# Patient Record
Sex: Male | Born: 1995 | Race: Black or African American | Hispanic: No | Marital: Single | Smoking: Former smoker
Health system: Southern US, Community
[De-identification: ages and names within clinical notes are randomized; demographics above are authoritative.]

## PROBLEM LIST (undated history)

## (undated) DIAGNOSIS — J45909 Unspecified asthma, uncomplicated: Secondary | ICD-10-CM

## (undated) DIAGNOSIS — F909 Attention-deficit hyperactivity disorder, unspecified type: Secondary | ICD-10-CM

---
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
## 2017-01-17 ENCOUNTER — Encounter (HOSPITAL_COMMUNITY): Payer: Self-pay | Admitting: Emergency Medicine

## 2017-01-17 ENCOUNTER — Other Ambulatory Visit: Payer: Self-pay

## 2017-01-17 ENCOUNTER — Emergency Department (HOSPITAL_COMMUNITY)
Admission: EM | Admit: 2017-01-17 | Discharge: 2017-01-17 | Disposition: A | Discharge status: Home / Self Care | Payer: Medicaid Other | Attending: Emergency Medicine | Admitting: Emergency Medicine

## 2017-01-17 DIAGNOSIS — J029 Acute pharyngitis, unspecified: Secondary | ICD-10-CM | POA: Diagnosis present

## 2017-01-17 DIAGNOSIS — F909 Attention-deficit hyperactivity disorder, unspecified type: Secondary | ICD-10-CM | POA: Insufficient documentation

## 2017-01-17 DIAGNOSIS — J069 Acute upper respiratory infection, unspecified: Secondary | ICD-10-CM | POA: Diagnosis not present

## 2017-01-17 DIAGNOSIS — J45909 Unspecified asthma, uncomplicated: Secondary | ICD-10-CM | POA: Diagnosis not present

## 2017-01-17 LAB — RAPID STREP SCREEN (MED CTR MEBANE ONLY): STREPTOCOCCUS, GROUP A SCREEN (DIRECT): NEGATIVE

## 2017-01-17 MED ORDER — FLUTICASONE PROPIONATE 50 MCG/ACT NA SUSP: 1.0000 | Freq: Every day | NASAL | 0 refills | Status: DC

## 2017-01-17 MED ORDER — PROMETHAZINE-DM 6.25-15 MG/5ML PO SYRP: 5.0000 mL | ORAL_SOLUTION | Freq: Four times a day (QID) | ORAL | 0 refills | Status: DC | PRN

## 2017-01-17 NOTE — Discharge Instructions (Signed)
You likely have a viral illness.  This will be treated symptomatically. Use Tylenol or ibuprofen as needed for fevers, body aches, and sore throat. Use Flonase daily for nasal congestion and cough. Use cough syrup as needed for cough or sore throat. Make sure you stay well-hydrated with water. Wash your hands frequently to prevent spread of infection. Follow-up with your primary care doctor in 1 week if your symptoms are not improving. Return to the emergency room if you develop chest pain, difficulty breathing, or any new or worsening symptoms.

## 2017-01-17 NOTE — ED Triage Notes (Signed)
Pt reports right ear pain and sore throat x 3 days.

## 2017-01-17 NOTE — ED Provider Notes (Signed)
[facility] Provider [facility]   CSN: [id number] Arrival date & time: 01/17/17  0945     History   Chief Complaint Chief Complaint  Patient presents with  . Sore Throat    HPI Jeremy Michael is a 21 y.o. male presenting with sore throat and ear pain.  Pt states that for the past 3 days, he has had a sore throat.  Over the past day, pain has radiated to his right ear.  He reports pain with swallowing.  He denies fevers, chills, nasal congestion, eye pain or irritation, cough, chest pain, shortness breath, nausea, vomiting, abdominal pain.  He denies sick contacts.  He denies difficulty handling his secretions or pain with opening his mouth.  He states he does not take any other medicines daily and has no medical history.  He has been using over-the-counter sore throat spray without improvement of symptoms.  HPI  Past Medical History:  Diagnosis Date  . ADHD (attention deficit hyperactivity disorder)   . Asthma     Patient Active Problem List   Diagnosis Date Noted  . Left tibial fracture 08/01/2012    Past Surgical History:  Procedure Laterality Date  . ORIF PATELLA Left 08/02/2012   Procedure: OPEN REDUCTION INTERNAL (ORIF) FIXATION Proximal tibia;  Surgeon: Eulas PostJoshua P Landau, MD;  Location: [facility] OR[facility];  Service: Orthopedics;  Laterality: Left;       Home Medications    Prior to Admission medications   Medication Sig Start Date End Date Taking? Authorizing Provider  cyclobenzaprine (FLEXERIL) 10 MG tablet Take 1 tablet (10 mg total) by mouth 3 (three) times daily as needed. Patient not taking: Reported on 10/24/2016 11/08/15   Triplett, Tammy, PA-C  fluticasone (FLONASE) 50 MCG/ACT nasal spray Place 1 spray into both nostrils daily. 01/17/17   Tavarus Poteete, PA-C  ibuprofen (ADVIL,MOTRIN) 600 MG tablet Take 1 tablet (600 mg total) 4 (four) times daily by mouth. Patient not taking: Reported on 01/17/2017 12/14/16   Ivery QualeBryant, Hobson, PA-C    promethazine-dextromethorphan (PROMETHAZINE-DM) 6.25-15 MG/5ML syrup Take 5 mLs by mouth 4 (four) times daily as needed for cough. 01/17/17   Breiona Couvillon, PA-C    Family History History reviewed. No pertinent family history.  Social History Social History   Tobacco Use  . Smoking status: Never Smoker  . Smokeless tobacco: Never Used  Substance Use Topics  . Alcohol use: No  . Drug use: No     Allergies   Halls cough drops [menthol]   Review of Systems Review of Systems  Constitutional: Negative for chills and fever.  HENT: Positive for ear pain and sore throat. Negative for congestion, sinus pressure, sinus pain and voice change.      Physical Exam Updated Vital Signs BP 131/76 (BP Location: Right Arm)   Pulse 67   Temp 98.2 F (36.8 C) (Oral)   Resp 16   Ht 5\' 7"  (1.702 m)   Wt 79.4 kg (175 lb)   SpO2 100%   BMI 27.41 kg/m   Physical Exam  Constitutional: He is oriented to person, place, and time. He appears well-developed and well-nourished. No distress.  HENT:  Head: Normocephalic and atraumatic.  Right Ear: Tympanic membrane, external ear and ear canal normal.  Left Ear: Tympanic membrane, external ear and ear canal normal.  Nose: Nose normal. Right sinus exhibits no maxillary sinus tenderness and no frontal sinus tenderness. Left sinus exhibits no maxillary sinus tenderness and no frontal sinus tenderness.  Mouth/Throat: Uvula is midline,  oropharynx is clear and moist and mucous membranes are normal. No tonsillar exudate.  OP clear without tonsillar swelling or exudate.  Uvula midline with equal palate rise.  No pain under the tongue.  No trismus.  No difficulty handling secretions.  Voice is not muffled.  Eyes: Conjunctivae and EOM are normal. Pupils are equal, round, and reactive to light.  Neck: Normal range of motion.  Cardiovascular: Normal rate, regular rhythm and intact distal pulses.  Pulmonary/Chest: Effort normal and breath sounds normal.  He has no decreased breath sounds. He has no wheezes. He has no rhonchi. He has no rales.  Pt speaking in full sentences without difficulty. Clear lung sounds in all fields  Abdominal: He exhibits no distension.  Musculoskeletal: Normal range of motion.  Lymphadenopathy:    He has no cervical adenopathy.  Neurological: He is alert and oriented to person, place, and time.  Skin: Skin is warm.  Psychiatric: He has a normal mood and affect.  Nursing note and vitals reviewed.    ED Treatments / Results  Labs (all labs ordered are listed, but only abnormal results are displayed) Labs Reviewed  RAPID STREP SCREEN (NOT AT [facility])  CULTURE, GROUP A STREP [facility])    EKG  EKG Interpretation None       Radiology No results found.  Procedures Procedures (including critical care time)  Medications Ordered in ED Medications - No data to display   Initial Impression / Assessment and Plan / ED Course  I have reviewed the triage vital signs and the nursing notes.  Pertinent labs & imaging results that were available during my care of the patient were reviewed by me and considered in my medical decision making (see chart for details).     Patient presenting with sore throat x3 days.  Physical exam reassuring, he is afebrile, not tachycardic, appears nontoxic.  No tonsillar swelling or exudate.  Doubt strep, peritonsillar, or bacterial infection.  Likely viral illness.  Strep test pending.  Strep negative.  Will treat symptomatically.  Discussed findings with patient.  At this time, patient appears safe for discharge.  Return precautions given.  Patient states he understands and agrees to plan.   Final Clinical Impressions(s) / ED Diagnoses   Final diagnoses:  Upper respiratory tract infection, unspecified type    ED Discharge Orders        Ordered    fluticasone (FLONASE) 50 MCG/ACT nasal spray  Daily     01/17/17 1104    promethazine-dextromethorphan (PROMETHAZINE-DM) 6.25-15  MG/5ML syrup  4 times daily PRN     01/17/17 1104       Hadar Elgersma, PA-C 01/17/17 1548    Doug SouJacubowitz, Sam, MD 01/17/17 1614

## 2017-01-19 LAB — CULTURE, GROUP A STREP (THRC)

## 2017-01-28 ENCOUNTER — Encounter (HOSPITAL_COMMUNITY): Payer: Self-pay | Admitting: Emergency Medicine

## 2017-01-28 ENCOUNTER — Emergency Department (HOSPITAL_COMMUNITY): Payer: Medicaid Other

## 2017-01-28 ENCOUNTER — Other Ambulatory Visit: Payer: Self-pay

## 2017-01-28 ENCOUNTER — Emergency Department (HOSPITAL_COMMUNITY)
Admission: EM | Admit: 2017-01-28 | Discharge: 2017-01-28 | Disposition: A | Discharge status: Home / Self Care | Payer: Medicaid Other | Attending: Emergency Medicine | Admitting: Emergency Medicine

## 2017-01-28 DIAGNOSIS — Y999 Unspecified external cause status: Secondary | ICD-10-CM | POA: Diagnosis not present

## 2017-01-28 DIAGNOSIS — Y939 Activity, unspecified: Secondary | ICD-10-CM | POA: Diagnosis not present

## 2017-01-28 DIAGNOSIS — W2209XA Striking against other stationary object, initial encounter: Secondary | ICD-10-CM | POA: Diagnosis not present

## 2017-01-28 DIAGNOSIS — S6991XA Unspecified injury of right wrist, hand and finger(s), initial encounter: Secondary | ICD-10-CM | POA: Diagnosis present

## 2017-01-28 DIAGNOSIS — J45909 Unspecified asthma, uncomplicated: Secondary | ICD-10-CM | POA: Diagnosis not present

## 2017-01-28 DIAGNOSIS — F909 Attention-deficit hyperactivity disorder, unspecified type: Secondary | ICD-10-CM | POA: Insufficient documentation

## 2017-01-28 DIAGNOSIS — Y929 Unspecified place or not applicable: Secondary | ICD-10-CM | POA: Insufficient documentation

## 2017-01-28 DIAGNOSIS — S62101A Fracture of unspecified carpal bone, right wrist, initial encounter for closed fracture: Secondary | ICD-10-CM | POA: Diagnosis not present

## 2017-01-28 MED ORDER — IBUPROFEN 600 MG PO TABS: 600.0000 mg | ORAL_TABLET | Freq: Four times a day (QID) | ORAL | 0 refills | Status: DC | PRN

## 2017-01-28 MED ORDER — HYDROCODONE-ACETAMINOPHEN 5-325 MG PO TABS: ORAL_TABLET | ORAL | 0 refills | Status: DC

## 2017-01-28 NOTE — Discharge Instructions (Signed)
Elevate your hand when possible.  Call one of the orthopedic providers listed to arrange a follow-up appt
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
## 2017-02-03 ENCOUNTER — Emergency Department (HOSPITAL_COMMUNITY)
Admission: EM | Admit: 2017-02-03 | Discharge: 2017-02-03 | Disposition: A | Discharge status: Home / Self Care | Payer: Medicaid Other | Attending: Emergency Medicine | Admitting: Emergency Medicine

## 2017-02-03 ENCOUNTER — Encounter (HOSPITAL_COMMUNITY): Payer: Self-pay

## 2017-02-03 DIAGNOSIS — S62001A Unspecified fracture of navicular [scaphoid] bone of right wrist, initial encounter for closed fracture: Secondary | ICD-10-CM

## 2017-02-03 DIAGNOSIS — J45909 Unspecified asthma, uncomplicated: Secondary | ICD-10-CM | POA: Diagnosis not present

## 2017-02-03 DIAGNOSIS — X58XXXD Exposure to other specified factors, subsequent encounter: Secondary | ICD-10-CM | POA: Diagnosis not present

## 2017-02-03 DIAGNOSIS — S6991XD Unspecified injury of right wrist, hand and finger(s), subsequent encounter: Secondary | ICD-10-CM | POA: Diagnosis present

## 2017-02-03 DIAGNOSIS — S62001D Unspecified fracture of navicular [scaphoid] bone of right wrist, subsequent encounter for fracture with routine healing: Secondary | ICD-10-CM | POA: Diagnosis not present

## 2017-02-03 NOTE — Discharge Instructions (Signed)
Please keep your splint clean and dry.  Please see Dr. Romeo AppleHarrison for fracture management as soon as possible.

## 2017-02-03 NOTE — ED Triage Notes (Signed)
Pt seen 6 days for closed fracture of right wrist. Pt reports he has not followed up with harrison and hand specialist. States he did not have a ride and has been unable to get pain meds filled. Pt has temporary cast on with sling.

## 2017-02-03 NOTE — ED Provider Notes (Signed)
[facility] EMERGENCY DEPARTMENT Provider Note   CSN: [id number] Arrival date & time: 02/03/17  4098     History   Chief Complaint Chief Complaint  Patient presents with  . Hand Pain    HPI Jeremy Michael is a 22 y.o. male.  Patient has a glucose  Patient is a 22 year old male sustained a fracture to his right navicular on January 2.  The patient wanted to get his cast removed and wanted to have the fracture evaluated.  He was referred to orthopedics but says he is not been able to contact orthopedics since the incident.  He has good range of motion of the fingers.  No nausea/vomiting/fever or swelling of the extremity.      Past Medical History:  Diagnosis Date  . ADHD (attention deficit hyperactivity disorder)   . Asthma     Patient Active Problem List   Diagnosis Date Noted  . Left tibial fracture 08/01/2012    Past Surgical History:  Procedure Laterality Date  . ORIF PATELLA Left 08/02/2012   Procedure: OPEN REDUCTION INTERNAL (ORIF) FIXATION Proximal tibia;  Surgeon: Eulas Post, MD;  Location: [facility];  Service: Orthopedics;  Laterality: Left;       Home Medications    Prior to Admission medications   Medication Sig Start Date End Date Taking? Authorizing Provider  HYDROcodone-acetaminophen (NORCO/VICODIN) 5-325 MG tablet Take one tab po q 4 hrs prn pain 01/28/17   Triplett, Tammy, PA-C  ibuprofen (ADVIL,MOTRIN) 600 MG tablet Take 1 tablet (600 mg total) by mouth every 6 (six) hours as needed. 01/28/17   Triplett, Babette Relic, PA-C    Family History No family history on file.  Social History Social History   Tobacco Use  . Smoking status: Never Smoker  . Smokeless tobacco: Never Used  Substance Use Topics  . Alcohol use: No  . Drug use: Yes    Types: Marijuana     Allergies   Halls cough drops [menthol]   Review of Systems Review of Systems  Constitutional: Negative for activity change.       All ROS Neg except as noted in HPI  HENT:  Negative for nosebleeds.   Eyes: Negative for photophobia and discharge.  Respiratory: Negative for cough, shortness of breath and wheezing.   Cardiovascular: Negative for chest pain and palpitations.  Gastrointestinal: Negative for abdominal pain and blood in stool.  Genitourinary: Negative for dysuria, frequency and hematuria.  Musculoskeletal: Positive for arthralgias. Negative for back pain and neck pain.       Hand pain  Skin: Negative.   Neurological: Negative for dizziness, seizures and speech difficulty.  Psychiatric/Behavioral: Negative for confusion and hallucinations.     Physical Exam Updated Vital Signs BP 124/77 (BP Location: Right Arm)   Pulse 65   Temp 98.4 F (36.9 C) (Oral)   Resp 18   Ht 5\' 8"  (1.727 m)   Wt 81.6 kg (180 lb)   SpO2 100%   BMI 27.37 kg/m   Physical Exam  Constitutional: He is oriented to person, place, and time. He appears well-developed and well-nourished.  Non-toxic appearance.  HENT:  Head: Normocephalic.  Right Ear: Tympanic membrane and external ear normal.  Left Ear: Tympanic membrane and external ear normal.  Eyes: EOM and lids are normal. Pupils are equal, round, and reactive to light.  Neck: Normal range of motion. Neck supple. Carotid bruit is not present.  Cardiovascular: Normal rate, regular rhythm, normal heart sounds, intact distal pulses and normal pulses.  Pulmonary/Chest: Breath sounds normal. No respiratory distress.  Abdominal: Soft. Bowel sounds are normal. There is no tenderness. There is no guarding.  Musculoskeletal: Normal range of motion.  Patient has a cast/splint on the left wrist.  Capillary refill is less than 2 seconds.  Fingers are warm.  There is full range of motion of the left shoulder and elbow.  Lymphadenopathy:       Head (right side): No submandibular adenopathy present.       Head (left side): No submandibular adenopathy present.    He has no cervical adenopathy.  Neurological: He is alert and  oriented to person, place, and time. He has normal strength. No cranial nerve deficit or sensory deficit.  Skin: Skin is warm and dry.  Psychiatric: He has a normal mood and affect. His speech is normal.  Nursing note and vitals reviewed.    ED Treatments / Results  Labs (all labs ordered are listed, but only abnormal results are displayed) Labs Reviewed - No data to display  EKG  EKG Interpretation None       Radiology No results found.  Procedures Procedures (including critical care time)  Medications Ordered in ED Medications - No data to display   Initial Impression / Assessment and Plan / ED Course  I have reviewed the triage vital signs and the nursing notes.  Pertinent labs & imaging results that were available during my care of the patient were reviewed by me and considered in my medical decision making (see chart for details).       Final Clinical Impressions(s) / ED Diagnoses MDM I discussed with the patient that he has a navicular fracture on x-ray.  I reviewed the x-rays.  I also reviewed with the patient that he needs to be evaluated by orthopedics for splints Were Removed .  I discussed with him the importance of keeping a close follow-up on a navicular fracture.  The patient acknowledges understanding.  Patient is in agreement with this plan.  Given the patient the and the phone number Dr. Romeo AppleHarrison, the patient says he will follow-up   Final diagnoses:  Closed nondisplaced fracture of scaphoid of right wrist, unspecified portion of scaphoid, initial encounter    ED Discharge Orders    None       Ivery QualeBryant, Teonna Coonan, PA-C 02/03/17 1846    Doug SouJacubowitz, Sam, MD 02/04/17 [phone]

## 2017-02-06 ENCOUNTER — Encounter: Payer: Self-pay | Admitting: Orthopedic Surgery

## 2017-02-06 ENCOUNTER — Telehealth: Payer: Self-pay | Admitting: Orthopedic Surgery

## 2017-02-06 NOTE — Telephone Encounter (Signed)
Patient called today for an appointment.  I told him that I would have to pull his information and call him back.  Patient [place] for his [place].  I called [facility] and they stated that he has not been to their office since 2017 therefore he would need to schedule an appointment with them before they could give us approval.  I have tried to call this patient back multiple times to let him know he needs to see his PCP but I get a message stating his voicemail has not been set up.  I tried his emergency contact number but this number is not working.  I will send a letter to the patient asking to him to call our office ASAP.

## 2017-02-11 ENCOUNTER — Telehealth: Payer: Self-pay | Admitting: Orthopedic Surgery

## 2017-02-11 NOTE — Telephone Encounter (Signed)
Patient called late this afternoon, (4:55) asking about an appointment with Dr. Romeo AppleHarrison.  I told him that we tried to call him back several times the other day but could not leave a message on his voicemail.  I told him that his PCP states that he will probably needs to be seen there before they will give approval for him to come to our office.  I told him to call them ASAP so that they can discuss about getting a referral to our office.  He said he would do this.

## 2017-02-12 ENCOUNTER — Telehealth: Payer: Self-pay | Admitting: Orthopedic Surgery

## 2017-02-12 NOTE — Telephone Encounter (Signed)
I called and spoke to Mr. Jeremy Michael again today.  I asked him if he had spoken to his PCP to get a referral to our office.  He said he had not called them.  I reminded him that I had spoken with that office and they will not give approval for him to be seen here until they at least talk to him.  I gave him the PCP phone number and asked him to give them.  He said he would

## 2017-02-25 ENCOUNTER — Other Ambulatory Visit: Payer: Self-pay

## 2017-02-25 ENCOUNTER — Encounter (HOSPITAL_COMMUNITY): Payer: Self-pay | Admitting: *Deleted

## 2017-02-25 ENCOUNTER — Emergency Department (HOSPITAL_COMMUNITY)
Admission: EM | Admit: 2017-02-25 | Discharge: 2017-02-25 | Disposition: A | Discharge status: Home / Self Care | Payer: Medicaid Other | Attending: Emergency Medicine | Admitting: Emergency Medicine

## 2017-02-25 DIAGNOSIS — S62101D Fracture of unspecified carpal bone, right wrist, subsequent encounter for fracture with routine healing: Secondary | ICD-10-CM | POA: Diagnosis not present

## 2017-02-25 DIAGNOSIS — J45909 Unspecified asthma, uncomplicated: Secondary | ICD-10-CM | POA: Insufficient documentation

## 2017-02-25 DIAGNOSIS — X58XXXD Exposure to other specified factors, subsequent encounter: Secondary | ICD-10-CM | POA: Insufficient documentation

## 2017-02-25 DIAGNOSIS — S62101S Fracture of unspecified carpal bone, right wrist, sequela: Secondary | ICD-10-CM

## 2017-02-25 NOTE — Discharge Instructions (Addendum)
You have a broken bone in your wrists because of the navicular or scaphoid bone.  It is very important that she was seen by the orthopedic specialist concerning this particular fracture.  Please keep your's splint clean and dry.  Use your sling.  Please see Dr. Romeo AppleHarrison as soon as possible concerning your fracture. VERY IMPORTANT.

## 2017-02-25 NOTE — ED Provider Notes (Signed)
[facility] EMERGENCY DEPARTMENT Provider Note   CSN: [id number] Arrival date & time: 02/25/17  1911     History   Chief Complaint Chief Complaint  Patient presents with  . Wrist Pain    HPI Jeremy Michael is a 22 y.o. male.  Patient is a 22 year old male who presents to the emergency department because of wrist pain.  The patient sustained a fracture to his navicular on January 1.  He was reevaluated on January 7 for the same problem.  He presents now because he says he still having pain.  He states he has not been to see the orthopedic specialist.  He is aware that on both occasions he was told about the importance of seeing an orthopedic specialist because of the navicular type fracture.  The patient is requesting to have the splint redone and he would also like to have the information that he was given with the orthopedic surgeon name address and phone number.  No new falls reported.  No new injury to the wrist reported.      Past Medical History:  Diagnosis Date  . ADHD (attention deficit hyperactivity disorder)   . Asthma     Patient Active Problem List   Diagnosis Date Noted  . Left tibial fracture 08/01/2012    Past Surgical History:  Procedure Laterality Date  . ORIF PATELLA Left 08/02/2012   Procedure: OPEN REDUCTION INTERNAL (ORIF) FIXATION Proximal tibia;  Surgeon: Eulas PostJoshua P Landau, MD;  Location: [facility];  Service: Orthopedics;  Laterality: Left;       Home Medications    Prior to Admission medications   Medication Sig Start Date End Date Taking? Authorizing Provider  HYDROcodone-acetaminophen (NORCO/VICODIN) 5-325 MG tablet Take one tab po q 4 hrs prn pain 01/28/17   Triplett, Tammy, PA-C  ibuprofen (ADVIL,MOTRIN) 600 MG tablet Take 1 tablet (600 mg total) by mouth every 6 (six) hours as needed. 01/28/17   Triplett, Babette Relicammy, PA-C    Family History No family history on file.  Social History Social History   Tobacco Use  . Smoking status: Never  Smoker  . Smokeless tobacco: Never Used  Substance Use Topics  . Alcohol use: No  . Drug use: Yes    Types: Marijuana     Allergies   Halls cough drops [menthol]   Review of Systems Review of Systems  Constitutional: Negative for activity change.       All ROS Neg except as noted in HPI  HENT: Negative for nosebleeds.   Eyes: Negative for photophobia and discharge.  Respiratory: Negative for cough, shortness of breath and wheezing.   Cardiovascular: Negative for chest pain and palpitations.  Gastrointestinal: Negative for abdominal pain and blood in stool.  Genitourinary: Negative for dysuria, frequency and hematuria.  Musculoskeletal: Positive for arthralgias. Negative for back pain and neck pain.  Skin: Negative.   Neurological: Negative for dizziness, seizures and speech difficulty.  Psychiatric/Behavioral: Negative for confusion and hallucinations.     Physical Exam Updated Vital Signs BP 117/71   Pulse 87   Temp 98.7 F (37.1 C)   Resp 20   Ht 5\' 7"  (1.702 m)   Wt 81.6 kg (180 lb)   SpO2 96%   BMI 28.19 kg/m    Physical Exam  Constitutional: He is oriented to person, place, and time. He appears well-developed and well-nourished.  Non-toxic appearance.  HENT:  Head: Normocephalic.  Right Ear: Tympanic membrane and external ear normal.  Left Ear: Tympanic membrane and  external ear normal.  Eyes: EOM and lids are normal. Pupils are equal, round, and reactive to light.  Neck: Normal range of motion. Neck supple. Carotid bruit is not present.  Cardiovascular: Normal rate, regular rhythm, normal heart sounds, intact distal pulses and normal pulses.  Pulmonary/Chest: Breath sounds normal. No respiratory distress.  Abdominal: Soft. Bowel sounds are normal. There is no tenderness. There is no guarding.  Musculoskeletal: Normal range of motion.  There is full range of motion of the right shoulder.  The patient is placed in a sugar tong splint because of a navicular  fracture.  Capillary refill is less than 2 seconds.  There are no temperature changes of the fingers.  There is good range of motion of the fingers.  No sensory deficits appreciated.  The splint remains in place.  The Ace wrap has worn out.  Lymphadenopathy:       Head (right side): No submandibular adenopathy present.       Head (left side): No submandibular adenopathy present.    He has no cervical adenopathy.  Neurological: He is alert and oriented to person, place, and time. He has normal strength. No cranial nerve deficit or sensory deficit.  Skin: Skin is warm and dry.  Psychiatric: He has a normal mood and affect. His speech is normal.  Nursing note and vitals reviewed.    ED Treatments / Results  Labs (all labs ordered are listed, but only abnormal results are displayed) Labs Reviewed - No data to display  EKG  EKG Interpretation None       Radiology No results found.  Procedures Procedures (including critical care time)  Medications Ordered in ED Medications - No data to display   Initial Impression / Assessment and Plan / ED Course  I have reviewed the triage vital signs and the nursing notes.  Pertinent labs & imaging results that were available during my care of the patient were reviewed by me and considered in my medical decision making (see chart for details).       Final Clinical Impressions(s) / ED Diagnoses MDM  Patient sustained a fracture to the right wrist January 1.  He was advised to see orthopedics.  Returned on January 7 for reassessment.  He was again strongly advised to see orthopedics because of the navicular type fracture.  He returns tonight to have his splint adjusted.  He states that he is going to call the orthopedic specialist on tomorrow for an appointment.  Patient also requests the name and phone number of the physician that he was referred to.  An Ace bandage was replaced on the splint.  The sling remains strong and intact.  Again  it was emphasized to the patient the importance of seeing orthopedics concerning this particular fracture of the wrist.  The patient acknowledges understanding of the fracture, and the importance of being seen by orthopedics as soon as possible.   Final diagnoses:  Closed fracture of right wrist, sequela    ED Discharge Orders    None      Ivery Quale, PA-C 02/25/17 2148  Ivery Quale, PA-C 02/25/17 2210  Mancel Bale, MD 02/28/17 2326

## 2017-02-25 NOTE — ED Triage Notes (Signed)
Return visit for wrist fracture, has not followed up with ortho, patient still has splint in  Place from ed visit

## 2017-02-25 NOTE — ED Notes (Signed)
Pt ambulatory to waiting room. Pt verbalized understanding of discharge instructions.   

## 2017-03-04 ENCOUNTER — Encounter: Payer: Self-pay | Admitting: Orthopedic Surgery

## 2017-03-04 ENCOUNTER — Telehealth: Payer: Self-pay | Admitting: Orthopedic Surgery

## 2017-03-04 NOTE — Telephone Encounter (Signed)
°  I have spoken with Mr. Jeremy Michael several times and have explained that in order for him to be seen here, he needed to get Medicaid approval from his family doctor.  I was told by his primary care physician's office that since they have not seen him in a long time, they would not give approval for him to be seen here unless he was seen in their office first.  I explained this to him several times.  I have spoken to the clinical staff, Amy Littrell who assists Dr. Romeo AppleHarrison in the clinical part of this office and she has suggested that it would  be better for him to schedule an appointment with [facility] [place].  Their phone number is [phone].  I have tried to call him several times yesterday afternoon and today but have been unable to speak with him regarding this.  Therefore, I have sent him a letter regarding this.

## 2017-03-05 NOTE — Telephone Encounter (Signed)
Patient called back today - said he had not received our calls, which were made multiple times. Also relayed a letter has been mailed to him.  Relayed the information in telephone encounter as noted, including the phone number for [facility] [place], where he has been recommended to be seen, per our providers.  States he will do this.

## 2017-04-23 ENCOUNTER — Other Ambulatory Visit: Payer: Self-pay

## 2017-04-23 ENCOUNTER — Emergency Department (HOSPITAL_COMMUNITY): Payer: Medicaid Other

## 2017-04-23 ENCOUNTER — Emergency Department (HOSPITAL_COMMUNITY)
Admission: EM | Admit: 2017-04-23 | Discharge: 2017-04-23 | Disposition: A | Discharge status: Home / Self Care | Payer: Medicaid Other | Attending: Emergency Medicine | Admitting: Emergency Medicine

## 2017-04-23 ENCOUNTER — Encounter (HOSPITAL_COMMUNITY): Payer: Self-pay | Admitting: Emergency Medicine

## 2017-04-23 DIAGNOSIS — F909 Attention-deficit hyperactivity disorder, unspecified type: Secondary | ICD-10-CM | POA: Diagnosis not present

## 2017-04-23 DIAGNOSIS — J45909 Unspecified asthma, uncomplicated: Secondary | ICD-10-CM | POA: Insufficient documentation

## 2017-04-23 DIAGNOSIS — M25531 Pain in right wrist: Secondary | ICD-10-CM | POA: Diagnosis present

## 2017-04-23 MED ORDER — IBUPROFEN 400 MG PO TABS
400.0000 mg | ORAL_TABLET | Freq: Once | ORAL | Status: AC
  Administered 2017-04-23: 400 mg via ORAL
  Filled 2017-04-23 (×2): qty 1

## 2017-04-23 MED ORDER — NAPROXEN 500 MG PO TABS: 500.0000 mg | ORAL_TABLET | Freq: Two times a day (BID) | ORAL | 0 refills | Status: DC

## 2017-04-23 NOTE — Discharge Instructions (Addendum)
You were seen today for wrist pain.  Your x-ray shows healing prior wrist fracture.  You likely will develop some arthritis.  Take naproxen as needed for pain.  Follow-up with orthopedics if you have persistent symptoms.

## 2017-04-23 NOTE — ED Triage Notes (Signed)
Pt c/o right wrist pain after waking up. Pt states he has previous injury and thinks he slept on it last night.

## 2017-04-23 NOTE — ED Notes (Signed)
Patient transported to X-ray 

## 2017-04-23 NOTE — ED Provider Notes (Signed)
[facility] EMERGENCY DEPARTMENT Provider Note   CSN: [id number] Arrival date & time: 04/23/17  0551     History   Chief Complaint Chief Complaint  Patient presents with  . Wrist Pain    HPI Jeremy Michael is a 22 y.o. male.  HPI  This is a 22 year old male with a history of ADHD, asthma who presents with right wrist pain.  Patient reports wrist injury in January.  He never followed up with orthopedics.  He states occasionally he has pain.  He went to bed last night without pain but woke up this morning and thinks "I slept on it wrong."  He reports 6 out of 10 throbbing wrist pain.  Denies numbness or tingling.  Denies new injury.  He has not taken anything for his symptoms.  Chart reviewed.  Patient with probable navicular nondisplaced fracture on January 1.  He had several subsequent visits where he had not followed up and was encouraged to follow-up closely.  Patient states that he was unable to get into see Dr. Romeo Apple.  Past Medical History:  Diagnosis Date  . ADHD (attention deficit hyperactivity disorder)   . Asthma     Patient Active Problem List   Diagnosis Date Noted  . Left tibial fracture 08/01/2012    Past Surgical History:  Procedure Laterality Date  . ORIF PATELLA Left 08/02/2012   Procedure: OPEN REDUCTION INTERNAL (ORIF) FIXATION Proximal tibia;  Surgeon: Eulas Post, MD;  Location: [facility];  Service: Orthopedics;  Laterality: Left;        Home Medications    Prior to Admission medications   Medication Sig Start Date End Date Taking? Authorizing Provider  HYDROcodone-acetaminophen (NORCO/VICODIN) 5-325 MG tablet Take one tab po q 4 hrs prn pain 01/28/17   Triplett, Tammy, PA-C  ibuprofen (ADVIL,MOTRIN) 600 MG tablet Take 1 tablet (600 mg total) by mouth every 6 (six) hours as needed. 01/28/17   Triplett, Tammy, PA-C  naproxen (NAPROSYN) 500 MG tablet Take 1 tablet (500 mg total) by mouth 2 (two) times daily. 04/23/17   Horton, Mayer Masker, MD     Family History No family history on file.  Social History Social History   Tobacco Use  . Smoking status: Never Smoker  . Smokeless tobacco: Never Used  Substance Use Topics  . Alcohol use: No  . Drug use: Not Currently    Types: Marijuana     Allergies   Halls cough drops [menthol]   Review of Systems Review of Systems  Musculoskeletal:       Right wrist pain  Neurological: Negative for weakness and numbness.  All other systems reviewed and are negative.    Physical Exam Updated Vital Signs BP 118/81 (BP Location: Left Arm)   Pulse 61   Temp 97.8 F (36.6 C) (Oral)   Resp 18   Ht 5\' 8"  (1.727 m)   Wt 81.6 kg (180 lb)   SpO2 100%   BMI 27.37 kg/m   Physical Exam  Constitutional: He is oriented to person, place, and time. He appears well-developed and well-nourished.  HENT:  Head: Normocephalic and atraumatic.  Cardiovascular: Normal rate and regular rhythm.  Pulmonary/Chest: Effort normal. No respiratory distress.  Musculoskeletal: He exhibits no edema.  Patient able to flex and extend the wrist approximately 45 degrees in both directions, no obvious deformity, tenderness to palpation over the palmar aspect of the wrist, neurovascularly intact, 2+ radial pulse  Neurological: He is alert and oriented to person, place, and  time.  Skin: Skin is warm and dry.  Psychiatric: He has a normal mood and affect.  Nursing note and vitals reviewed.    ED Treatments / Results  Labs (all labs ordered are listed, but only abnormal results are displayed) Labs Reviewed - No data to display  EKG None  Radiology Dg Wrist Complete Right  Result Date: 04/23/2017 CLINICAL DATA:  Pain.  Injury several months prior EXAM: RIGHT WRIST - COMPLETE 3+ VIEW COMPARISON:  January 28, 2017 FINDINGS: Frontal, oblique, lateral, and ulnar deviation scaphoid image is were obtained. There is subtle sclerosis in the mid scaphoid region at the site of prior apparent nondisplaced  fracture. No avascular necrosis noted. There is mild bowing of the distal fifth metacarpal, likely residua of old trauma. No acute fracture or dislocation evident. Joint spaces appear normal. No erosive change. IMPRESSION: Sclerosis mid scaphoid at site of prior fracture. The appearance is consistent with significant healing of prior fracture. No avascular necrosis. Trauma with remodeling distal fifth metacarpal. No evident acute fracture or dislocation. No appreciable joint space narrowing or erosion. Electronically Signed   By: Bretta BangWilliam  Woodruff III M.D.   On: 04/23/2017 07:26    Procedures Procedures (including critical care time)  Medications Ordered in ED Medications  ibuprofen (ADVIL,MOTRIN) tablet 400 mg (400 mg Oral Given 04/23/17 [id number])     Initial Impression / Assessment and Plan / ED Course  I have reviewed the triage vital signs and the nursing notes.  Pertinent labs & imaging results that were available during my care of the patient were reviewed by me and considered in my medical decision making (see chart for details).     Presents with right wrist pain.  Recent injury but has not followed up.  No recurrent injury.  He has not had any follow-up x-rays.  For this reason I have obtained follow-up x-rays to assess for appropriate healing.  Patient was given ibuprofen.  X-ray shows significant healing of prior fracture without avascular necrosis.  Suspect patient may be developing some arthritis in that wrist secondary to injury.  I again have encouraged him to follow-up with orthopedics given his persistent symptoms and young age with an dominant wrist injury.  Naproxen as needed for pain.  After history, exam, and medical workup I feel the patient has been appropriately medically screened and is safe for discharge home. Pertinent diagnoses were discussed with the patient. Patient was given return precautions.   Final Clinical Impressions(s) / ED Diagnoses   Final diagnoses:   Right wrist pain    ED Discharge Orders        Ordered    naproxen (NAPROSYN) 500 MG tablet  2 times daily     04/23/17 0732       Horton, Mayer Maskerourtney F, MD 04/23/17 [phone]

## 2017-06-04 ENCOUNTER — Encounter (HOSPITAL_COMMUNITY): Payer: Self-pay | Admitting: Emergency Medicine

## 2017-06-04 ENCOUNTER — Emergency Department (HOSPITAL_COMMUNITY)
Admission: EM | Admit: 2017-06-04 | Discharge: 2017-06-04 | Disposition: A | Discharge status: Home / Self Care | Payer: Medicaid Other | Attending: Emergency Medicine | Admitting: Emergency Medicine

## 2017-06-04 ENCOUNTER — Other Ambulatory Visit: Payer: Self-pay

## 2017-06-04 ENCOUNTER — Emergency Department (HOSPITAL_COMMUNITY): Payer: Medicaid Other

## 2017-06-04 DIAGNOSIS — Y9367 Activity, basketball: Secondary | ICD-10-CM | POA: Insufficient documentation

## 2017-06-04 DIAGNOSIS — S8392XA Sprain of unspecified site of left knee, initial encounter: Secondary | ICD-10-CM | POA: Diagnosis not present

## 2017-06-04 DIAGNOSIS — Y92310 Basketball court as the place of occurrence of the external cause: Secondary | ICD-10-CM | POA: Diagnosis not present

## 2017-06-04 DIAGNOSIS — F909 Attention-deficit hyperactivity disorder, unspecified type: Secondary | ICD-10-CM | POA: Diagnosis not present

## 2017-06-04 DIAGNOSIS — S8992XA Unspecified injury of left lower leg, initial encounter: Secondary | ICD-10-CM | POA: Diagnosis present

## 2017-06-04 DIAGNOSIS — Y998 Other external cause status: Secondary | ICD-10-CM | POA: Diagnosis not present

## 2017-06-04 DIAGNOSIS — J45909 Unspecified asthma, uncomplicated: Secondary | ICD-10-CM | POA: Insufficient documentation

## 2017-06-04 DIAGNOSIS — X509XXA Other and unspecified overexertion or strenuous movements or postures, initial encounter: Secondary | ICD-10-CM | POA: Insufficient documentation

## 2017-06-04 MED ORDER — IBUPROFEN 800 MG PO TABS: 800.0000 mg | ORAL_TABLET | Freq: Three times a day (TID) | ORAL | 0 refills | Status: DC

## 2017-06-04 NOTE — ED Provider Notes (Signed)
[facility] EMERGENCY DEPARTMENT Provider Note   CSN: [id number] Arrival date & time: 06/04/17  0200     History   Chief Complaint Chief Complaint  Patient presents with  . Knee Pain    HPI Jeremy Michael is a 22 y.o. male.  Patient presents with complaints of left knee injury.  Patient reports that he was playing basketball when he injured his knee.  He reports that he went up for a lay up and was pushed into a wall.  He is not sure if he banged the knee or twisted it, but he has noticed pain and swelling.  Pain worsens when he tries to walk.  No other injury.     Past Medical History:  Diagnosis Date  . ADHD (attention deficit hyperactivity disorder)   . Asthma     Patient Active Problem List   Diagnosis Date Noted  . Left tibial fracture 08/01/2012    Past Surgical History:  Procedure Laterality Date  . ORIF PATELLA Left 08/02/2012   Procedure: OPEN REDUCTION INTERNAL (ORIF) FIXATION Proximal tibia;  Surgeon: Eulas Post, MD;  Location: [facility];  Service: Orthopedics;  Laterality: Left;        Home Medications    Prior to Admission medications   Medication Sig Start Date End Date Taking? Authorizing Provider  HYDROcodone-acetaminophen (NORCO/VICODIN) 5-325 MG tablet Take one tab po q 4 hrs prn pain 01/28/17   Triplett, Tammy, PA-C  ibuprofen (ADVIL,MOTRIN) 600 MG tablet Take 1 tablet (600 mg total) by mouth every 6 (six) hours as needed. 01/28/17   Triplett, Tammy, PA-C  naproxen (NAPROSYN) 500 MG tablet Take 1 tablet (500 mg total) by mouth 2 (two) times daily. 04/23/17   Horton, Mayer Masker, MD    Family History No family history on file.  Social History Social History   Tobacco Use  . Smoking status: Never Smoker  . Smokeless tobacco: Never Used  Substance Use Topics  . Alcohol use: No  . Drug use: Not Currently    Types: Marijuana     Allergies   Halls cough drops [menthol]   Review of Systems Review of Systems  Musculoskeletal: Positive  for arthralgias.  All other systems reviewed and are negative.    Physical Exam Updated Vital Signs BP 129/84 (BP Location: Left Arm)   Pulse 86   Temp 99.2 F (37.3 C) (Oral)   Resp 16   Ht  (1.702 m)   Wt 68 kg (150 lb)   SpO2 96%   BMI 23.49 kg/m   Physical Exam  Constitutional: He appears well-developed.  HENT:  Head: Atraumatic.  Eyes: Pupils are equal, round, and reactive to light.  Cardiovascular: Normal rate.  Pulmonary/Chest: Breath sounds normal.  Musculoskeletal:       Left knee: He exhibits swelling (Prepatellar effusion). He exhibits normal range of motion, no deformity, no erythema and normal alignment. Tenderness found.     ED Treatments / Results  Labs (all labs ordered are listed, but only abnormal results are displayed) Labs Reviewed - No data to display  EKG None  Radiology Dg Knee Complete 4 Views Left  Result Date: 06/04/2017 CLINICAL DATA:  Initial evaluation for acute injury. EXAM: LEFT KNEE - COMPLETE 4+ VIEW COMPARISON:  Prior radiograph from 08/02/2012. FINDINGS: Postoperative changes present at the proximal tibia with 2 lag fixation screws present. Position alignment stable. No periprosthetic lucency to suggest loosening or failure. No acute fracture or dislocation. No joint effusion. Soft tissues within  normal limits. IMPRESSION: 1. No acute osseous abnormality about the left knee. 2. Sequelae of prior ORIF at the proximal tibia without hardware complication. Electronically Signed   By: Rise Mu M.D.   On: 06/04/2017 04:17    Procedures Procedures (including critical care time)  Medications Ordered in ED Medications - No data to display   Initial Impression / Assessment and Plan / ED Course  I have reviewed the triage vital signs and the nursing notes.  Pertinent labs & imaging results that were available during my care of the patient were reviewed by me and considered in my medical decision making (see chart for  details).     Patient presents with complaints of injury to left knee.  He has had a previous tibia fracture repair performed.  X-ray does not show any acute bone abnormality.  He has swelling and a prevertebral effusion, but no ligamentous instability.  Patient will be placed in knee immobilizer, follow-up with his orthopedic surgeon who performed previous surgery.  Final Clinical Impressions(s) / ED Diagnoses   Final diagnoses:  Sprain of left knee, unspecified ligament, initial encounter    ED Discharge Orders    None       Alivia Cimino, Canary Brim, MD 06/04/17 [phone]

## 2017-06-04 NOTE — ED Triage Notes (Signed)
Pt c/o left knee pain after running into wall and falling while playing basketball. Pt is ambulatory.

## 2017-06-04 NOTE — ED Notes (Signed)
Pt ambulatory to waiting room. Pt verbalized understanding of discharge instructions.   

## 2017-08-02 ENCOUNTER — Other Ambulatory Visit: Payer: Self-pay

## 2017-08-02 ENCOUNTER — Emergency Department (HOSPITAL_COMMUNITY)
Admission: EM | Admit: 2017-08-02 | Discharge: 2017-08-02 | Disposition: A | Discharge status: Home / Self Care | Payer: Medicaid Other | Attending: Emergency Medicine | Admitting: Emergency Medicine

## 2017-08-02 ENCOUNTER — Encounter (HOSPITAL_COMMUNITY): Payer: Self-pay | Admitting: Emergency Medicine

## 2017-08-02 DIAGNOSIS — J45909 Unspecified asthma, uncomplicated: Secondary | ICD-10-CM | POA: Diagnosis not present

## 2017-08-02 DIAGNOSIS — J029 Acute pharyngitis, unspecified: Secondary | ICD-10-CM

## 2017-08-02 DIAGNOSIS — B9789 Other viral agents as the cause of diseases classified elsewhere: Secondary | ICD-10-CM | POA: Insufficient documentation

## 2017-08-02 DIAGNOSIS — J069 Acute upper respiratory infection, unspecified: Secondary | ICD-10-CM | POA: Diagnosis not present

## 2017-08-02 LAB — GROUP A STREP BY PCR: GROUP A STREP BY PCR: NOT DETECTED

## 2017-08-02 MED ORDER — MAGIC MOUTHWASH W/LIDOCAINE: 5.0000 mL | Freq: Three times a day (TID) | ORAL | 0 refills | Status: DC | PRN

## 2017-08-02 MED ORDER — PSEUDOEPHEDRINE HCL 60 MG PO TABS: 60.0000 mg | ORAL_TABLET | Freq: Four times a day (QID) | ORAL | 0 refills | Status: DC | PRN

## 2017-08-02 NOTE — ED Provider Notes (Signed)
[facility] EMERGENCY DEPARTMENT Provider Note   CSN: [id number] Arrival date & time: 08/02/17  1227     History   Chief Complaint Chief Complaint  Patient presents with  . Sore Throat    HPI Jeremy Michael is a 22 y.o. male.  HPI  Jeremy Michael is a 22 y.o. male who presents to the Emergency Department complaining of nasal congestion, sore throat for 2 days.  Symptoms began with the nasal congestion, then sore throat.  No recent sick contacts or exposure to strep throat.  He has not taken any OTC cold medications.  He denies fever, cough, neck pain or shortness of breath.      Past Medical History:  Diagnosis Date  . ADHD (attention deficit hyperactivity disorder)   . Asthma     Patient Active Problem List   Diagnosis Date Noted  . Left tibial fracture 08/01/2012    Past Surgical History:  Procedure Laterality Date  . ORIF PATELLA Left 08/02/2012   Procedure: OPEN REDUCTION INTERNAL (ORIF) FIXATION Proximal tibia;  Surgeon: Eulas PostJoshua P Landau, MD;  Location: [facility];  Service: Orthopedics;  Laterality: Left;        Home Medications    Prior to Admission medications   Medication Sig Start Date End Date Taking? Authorizing Provider  ibuprofen (ADVIL,MOTRIN) 800 MG tablet Take 1 tablet (800 mg total) by mouth 3 (three) times daily. 06/04/17   Gilda CreasePollina, Christopher J, MD  magic mouthwash w/lidocaine SOLN Take 5 mLs by mouth 3 (three) times daily as needed for mouth pain. Swish and spit, do not swallow 08/02/17   Yamilka Lopiccolo, PA-C  pseudoephedrine (SUDAFED) 60 MG tablet Take 1 tablet (60 mg total) by mouth every 6 (six) hours as needed for congestion. 08/02/17   Pauline Ausriplett, Syon Tews, PA-C    Family History History reviewed. No pertinent family history.  Social History Social History   Tobacco Use  . Smoking status: Never Smoker  . Smokeless tobacco: Never Used  Substance Use Topics  . Alcohol use: No  . Drug use: Not Currently    Types: Marijuana      Allergies   Halls cough drops [menthol]   Review of Systems Review of Systems  Constitutional: Negative for activity change, appetite change, chills and fever.  HENT: Positive for congestion, rhinorrhea and sore throat. Negative for ear pain, facial swelling and trouble swallowing.   Eyes: Negative for visual disturbance.  Respiratory: Negative for cough, shortness of breath, wheezing and stridor.   Gastrointestinal: Negative for nausea and vomiting.  Musculoskeletal: Negative for neck pain and neck stiffness.  Skin: Negative for rash.  Neurological: Negative for dizziness, weakness, numbness and headaches.  Hematological: Negative for adenopathy.  All other systems reviewed and are negative.    Physical Exam Updated Vital Signs BP 136/81   Pulse (!) 51   Temp 98.2 F (36.8 C) (Oral)   Resp 18   Ht 5\' 7"  (1.702 m)   Wt 79.4 kg (175 lb)   SpO2 100%   BMI 27.41 kg/m   Physical Exam  Constitutional: He appears well-developed and well-nourished. No distress.  HENT:  Head: Normocephalic.  Right Ear: Tympanic membrane and ear canal normal.  Left Ear: Tympanic membrane and ear canal normal.  Nose: Mucosal edema present. No rhinorrhea.  Mouth/Throat: Uvula is midline and mucous membranes are normal. No trismus in the jaw. No uvula swelling. Posterior oropharyngeal erythema present. No oropharyngeal exudate, posterior oropharyngeal edema or tonsillar abscesses.  Eyes: Conjunctivae are normal.  Neck: Normal range of motion and phonation normal. Neck supple. No Brudzinski's sign and no Kernig's sign noted.  Cardiovascular: Normal rate, regular rhythm and intact distal pulses.  No murmur heard. Pulmonary/Chest: Effort normal and breath sounds normal. No respiratory distress. He has no wheezes. He has no rales.  Abdominal: Soft. There is no tenderness.  Musculoskeletal: He exhibits no edema.  Lymphadenopathy:    He has no cervical adenopathy.  Neurological: He is alert.   Skin: Skin is warm and dry. No rash noted.  Nursing note and vitals reviewed.    ED Treatments / Results  Labs (all labs ordered are listed, but only abnormal results are displayed) Labs Reviewed  GROUP A STREP BY PCR    EKG None  Radiology No results found.  Procedures Procedures (including critical care time)  Medications Ordered in ED Medications - No data to display   Initial Impression / Assessment and Plan / ED Course  I have reviewed the triage vital signs and the nursing notes.  Pertinent labs & imaging results that were available during my care of the patient were reviewed by me and considered in my medical decision making (see chart for details).     Pt with likely viral URI.  Pt is well appearing.  Vitals reviewed.  No concerning sx's for PTA. Pt agrees to symptomatic tx.    Final Clinical Impressions(s) / ED Diagnoses   Final diagnoses:  Upper respiratory tract infection, unspecified type  Viral pharyngitis    ED Discharge Orders        Ordered    magic mouthwash w/lidocaine SOLN  3 times daily PRN     08/02/17 1351    pseudoephedrine (SUDAFED) 60 MG tablet  Every 6 hours PRN     08/02/17 1351       Ashish Rossetti, [place], PA-C 08/03/17 2237    Linwood Dibbles, MD 08/05/17 1317

## 2017-08-02 NOTE — Discharge Instructions (Addendum)
Drink plenty of fluids.  Tylenol or ibuprofen if needed for pain or fever.  Follow-up with the clinic listed if your symptoms are not improving in a week or so

## 2017-08-02 NOTE — ED Triage Notes (Signed)
Sore throat X2 days with no fever.

## 2017-10-25 ENCOUNTER — Other Ambulatory Visit: Payer: Self-pay

## 2017-10-25 ENCOUNTER — Emergency Department (HOSPITAL_COMMUNITY)
Admission: EM | Admit: 2017-10-25 | Discharge: 2017-10-25 | Disposition: A | Discharge status: Home / Self Care | Payer: Medicaid Other | Attending: Emergency Medicine | Admitting: Emergency Medicine

## 2017-10-25 ENCOUNTER — Encounter (HOSPITAL_COMMUNITY): Payer: Self-pay | Admitting: Emergency Medicine

## 2017-10-25 ENCOUNTER — Emergency Department (HOSPITAL_COMMUNITY): Payer: Medicaid Other

## 2017-10-25 DIAGNOSIS — J45909 Unspecified asthma, uncomplicated: Secondary | ICD-10-CM | POA: Insufficient documentation

## 2017-10-25 DIAGNOSIS — F909 Attention-deficit hyperactivity disorder, unspecified type: Secondary | ICD-10-CM | POA: Insufficient documentation

## 2017-10-25 DIAGNOSIS — R1030 Lower abdominal pain, unspecified: Secondary | ICD-10-CM | POA: Diagnosis present

## 2017-10-25 DIAGNOSIS — K5900 Constipation, unspecified: Secondary | ICD-10-CM

## 2017-10-25 LAB — COMPREHENSIVE METABOLIC PANEL
ALT: 21 U/L (ref 0–44)
ANION GAP: 4 — AB (ref 5–15)
AST: 29 U/L (ref 15–41)
Albumin: 4.4 g/dL (ref 3.5–5.0)
Alkaline Phosphatase: 72 U/L (ref 38–126)
BUN: 7 mg/dL (ref 6–20)
CHLORIDE: 106 mmol/L (ref 98–111)
CO2: 29 mmol/L (ref 22–32)
Calcium: 9.2 mg/dL (ref 8.9–10.3)
Creatinine, Ser: 0.97 mg/dL (ref 0.61–1.24)
GFR calc non Af Amer: >60 mL/min (ref >60)
Glucose, Bld: 79 mg/dL (ref 70–99)
POTASSIUM: 3.8 mmol/L (ref 3.5–5.1)
SODIUM: 139 mmol/L (ref 135–145)
Total Bilirubin: 0.6 mg/dL (ref 0.3–1.2)
Total Protein: 8.1 g/dL (ref 6.5–8.1)

## 2017-10-25 LAB — CBC WITH DIFFERENTIAL/PLATELET
Basophils Absolute: 0 10*3/uL (ref 0.0–0.1)
Basophils Relative: 1 %
EOS PCT: 2 %
Eosinophils Absolute: 0.1 10*3/uL (ref 0.0–0.7)
HCT: 44.3 % (ref 39.0–52.0)
Hemoglobin: 15.6 g/dL (ref 13.0–17.0)
LYMPHS ABS: 1.7 10*3/uL (ref 0.7–4.0)
LYMPHS PCT: 43 %
MCH: 31.3 pg (ref 26.0–34.0)
MCHC: 35.2 g/dL (ref 30.0–36.0)
MCV: 89 fL (ref 78.0–100.0)
Monocytes Absolute: 0.3 10*3/uL (ref 0.1–1.0)
Monocytes Relative: 8 %
Neutro Abs: 1.8 10*3/uL (ref 1.7–7.7)
Neutrophils Relative %: 46 %
PLATELETS: 217 10*3/uL (ref 150–400)
RBC: 4.98 MIL/uL (ref 4.22–5.81)
RDW: 12.5 % (ref 11.5–15.5)
WBC: 3.9 10*3/uL — ABNORMAL LOW (ref 4.0–10.5)

## 2017-10-25 LAB — LIPASE, BLOOD: LIPASE: 40 U/L (ref 11–51)

## 2017-10-25 LAB — URINALYSIS, ROUTINE W REFLEX MICROSCOPIC
BILIRUBIN URINE: NEGATIVE
Glucose, UA: NEGATIVE mg/dL
Hgb urine dipstick: NEGATIVE
KETONES UR: NEGATIVE mg/dL
Leukocytes, UA: NEGATIVE
NITRITE: NEGATIVE
Protein, ur: NEGATIVE mg/dL
Specific Gravity, Urine: 1.015 (ref 1.005–1.030)
pH: 7 (ref 5.0–8.0)

## 2017-10-25 MED ORDER — POLYETHYLENE GLYCOL 3350 17 G PO PACK: 17.0000 g | PACK | Freq: Every day | ORAL | 0 refills | Status: DC

## 2017-10-25 MED ORDER — DOCUSATE SODIUM 100 MG PO CAPS: 100.0000 mg | ORAL_CAPSULE | Freq: Two times a day (BID) | ORAL | 0 refills | Status: DC

## 2017-10-25 NOTE — ED Provider Notes (Signed)
[facility] EMERGENCY DEPARTMENT Provider Note   CSN: [id number] Arrival date & time: 10/25/17  1339     History   Chief Complaint Chief Complaint  Patient presents with  . Abdominal Pain  . Constipation    HPI Jeremy Michael is a 22 y.o. male.  Pt presents to the ED today with lower abdominal pain and hard stools.  The pt said he's been constipated for the past several days.  He has not been taking anything otc.  The pt denies n/v or fevers.     Past Medical History:  Diagnosis Date  . ADHD (attention deficit hyperactivity disorder)   . Asthma     Patient Active Problem List   Diagnosis Date Noted  . Left tibial fracture 08/01/2012    Past Surgical History:  Procedure Laterality Date  . ORIF PATELLA Left 08/02/2012   Procedure: OPEN REDUCTION INTERNAL (ORIF) FIXATION Proximal tibia;  Surgeon: Eulas Post, MD;  Location: [facility];  Service: Orthopedics;  Laterality: Left;        Home Medications    Prior to Admission medications   Medication Sig Start Date End Date Taking? Authorizing Provider  docusate sodium (COLACE) 100 MG capsule Take 1 capsule (100 mg total) by mouth every 12 (twelve) hours. 10/25/17   Jacalyn Lefevre, MD  ibuprofen (ADVIL,MOTRIN) 800 MG tablet Take 1 tablet (800 mg total) by mouth 3 (three) times daily. 06/04/17   Gilda Crease, MD  magic mouthwash w/lidocaine SOLN Take 5 mLs by mouth 3 (three) times daily as needed for mouth pain. Swish and spit, do not swallow 08/02/17   Triplett, Tammy, PA-C  polyethylene glycol (MIRALAX) packet Take 17 g by mouth daily. 10/25/17   Jacalyn Lefevre, MD  pseudoephedrine (SUDAFED) 60 MG tablet Take 1 tablet (60 mg total) by mouth every 6 (six) hours as needed for congestion. 08/02/17   Pauline Aus, PA-C    Family History History reviewed. No pertinent family history.  Social History Social History   Tobacco Use  . Smoking status: Never Smoker  . Smokeless tobacco: Never Used  Substance  Use Topics  . Alcohol use: No  . Drug use: Not Currently    Types: Marijuana     Allergies   Halls cough drops [menthol]   Review of Systems Review of Systems  Gastrointestinal: Positive for abdominal pain and constipation.  All other systems reviewed and are negative.    Physical Exam Updated Vital Signs BP 132/71 (BP Location: Right Arm)   Pulse (!) 58   Temp 98.2 F (36.8 C) (Oral)   Resp 16   Ht 5\' 7"  (1.702 m)   Wt 77.1 kg   SpO2 99%   BMI 26.63 kg/m   Physical Exam  Constitutional: He is oriented to person, place, and time. He appears well-developed and well-nourished.  HENT:  Head: Normocephalic and atraumatic.  Mouth/Throat: Oropharynx is clear and moist.  Eyes: Pupils are equal, round, and reactive to light. EOM are normal.  Cardiovascular: Normal rate, regular rhythm, normal heart sounds and intact distal pulses.  Pulmonary/Chest: Effort normal and breath sounds normal.  Abdominal: Normal appearance and bowel sounds are normal.  Neurological: He is alert and oriented to person, place, and time.  Skin: Skin is warm and dry. Capillary refill takes less than 2 seconds.  Psychiatric: He has a normal mood and affect. His behavior is normal.  Nursing note and vitals reviewed.    ED Treatments / Results  Labs (all labs ordered  are listed, but only abnormal results are displayed) Labs Reviewed  CBC WITH DIFFERENTIAL/PLATELET - Abnormal; Notable for the following components:      Result Value   WBC 3.9 (*)    All other components within normal limits  COMPREHENSIVE METABOLIC PANEL - Abnormal; Notable for the following components:   Anion gap 4 (*)    All other components within normal limits  LIPASE, BLOOD  URINALYSIS, ROUTINE W REFLEX MICROSCOPIC    EKG None  Radiology Dg Abd Acute W/chest  Result Date: 10/25/2017 CLINICAL DATA:  Constipation.  Lower abdominal pain. EXAM: DG ABDOMEN ACUTE W/ 1V CHEST COMPARISON:  None. FINDINGS: There is no  evidence of dilated bowel loops or free intraperitoneal air. There is a moderate amount of stool throughout the colon. No radiopaque calculi or other significant radiographic abnormality is seen. Heart size and mediastinal contours are within normal limits. Both lungs are clear. Sclerotic bone lesion in the left ilium likely reflecting a bone island. IMPRESSION: Moderate amount of stool throughout the colon. No acute cardiopulmonary disease. Electronically Signed   By: Elige Ko   On: 10/25/2017 17:21    Procedures Procedures (including critical care time)  Medications Ordered in ED Medications - No data to display   Initial Impression / Assessment and Plan / ED Course  I have reviewed the triage vital signs and the nursing notes.  Pertinent labs & imaging results that were available during my care of the patient were reviewed by me and considered in my medical decision making (see chart for details).    Pt is constipated.  Labs/ua ok.  Pt instructed to drink more water and to eat more fiber.  He will be d/c with miralax and with colace.  Return if worse.    Final Clinical Impressions(s) / ED Diagnoses   Final diagnoses:  Constipation, unspecified constipation type    ED Discharge Orders         Ordered    polyethylene glycol (MIRALAX) packet  Daily     10/25/17 1726    docusate sodium (COLACE) 100 MG capsule  Every 12 hours     10/25/17 1726           Jacalyn Lefevre, MD 10/25/17 1729

## 2017-10-25 NOTE — ED Triage Notes (Signed)
Pt reports 4 days of lower abd pain Also last BM 30 minutes ago which was painful and hard

## 2018-10-01 ENCOUNTER — Emergency Department (HOSPITAL_COMMUNITY): Payer: Medicaid Other

## 2018-10-01 ENCOUNTER — Emergency Department (HOSPITAL_COMMUNITY)
Admission: EM | Admit: 2018-10-01 | Discharge: 2018-10-01 | Discharge status: Against Medical Advice | Payer: Medicaid Other | Attending: Emergency Medicine | Admitting: Emergency Medicine

## 2018-10-01 ENCOUNTER — Other Ambulatory Visit: Payer: Self-pay

## 2018-10-01 ENCOUNTER — Encounter (HOSPITAL_COMMUNITY): Payer: Self-pay | Admitting: Emergency Medicine

## 2018-10-01 DIAGNOSIS — R109 Unspecified abdominal pain: Secondary | ICD-10-CM

## 2018-10-01 DIAGNOSIS — R1030 Lower abdominal pain, unspecified: Secondary | ICD-10-CM | POA: Diagnosis present

## 2018-10-01 DIAGNOSIS — J45909 Unspecified asthma, uncomplicated: Secondary | ICD-10-CM | POA: Insufficient documentation

## 2018-10-01 DIAGNOSIS — Z87891 Personal history of nicotine dependence: Secondary | ICD-10-CM | POA: Diagnosis not present

## 2018-10-01 NOTE — ED Notes (Signed)
Pt was seen walking down the hallway to the exit door. Pt was stopped by another RN and asked if he was leaving and pt stated, "yeah, I'm leaving" and pt kept walking. Pt assumed to have left AMA. Pt did not sign AMA paperwork.

## 2018-10-01 NOTE — ED Triage Notes (Signed)
Pt c/o pain in left side of abd x 2 or 3 days.  Pt says pain worse when he touches the area.  Denies injury.  Denies any n/v/d.

## 2018-10-01 NOTE — ED Provider Notes (Signed)
[facility] EMERGENCY DEPARTMENT Provider Note   CSN: [id number] Arrival date & time: 10/01/18  0920     History   Chief Complaint Chief Complaint  Patient presents with  . Abdominal Pain    HPI Jeremy Michael is a 23 y.o. male.     HPI  This patient is a very healthy 23 year old male without any prior abdominal surgical history who presents with a complaint of left mid to lower abdominal discomfort which is more lateral, he states it seems to get worse when he rubs up into things, he does not have particular pain with palpation, there is no nausea vomiting or diarrhea and in fact he tends to be a little bit more on the constipated side.  He reports that he has had some discomfort intermittently over the last several days but has not had blood in his stools, no nausea, no postprandial pain, no difficulty with urination neither dysuria frequency or urgency.  He states he does not have the pain at this time.  Past Medical History:  Diagnosis Date  . ADHD (attention deficit hyperactivity disorder)   . Asthma     Patient Active Problem List   Diagnosis Date Noted  . Left tibial fracture 08/01/2012    Past Surgical History:  Procedure Laterality Date  . ORIF PATELLA Left 08/02/2012   Procedure: OPEN REDUCTION INTERNAL (ORIF) FIXATION Proximal tibia;  Surgeon: Johnny Bridge, MD;  Location: [facility];  Service: Orthopedics;  Laterality: Left;        Home Medications    Prior to Admission medications   Medication Sig Start Date End Date Taking? Authorizing Provider  docusate sodium (COLACE) 100 MG capsule Take 1 capsule (100 mg total) by mouth every 12 (twelve) hours. 10/25/17   Isla Pence, MD  ibuprofen (ADVIL,MOTRIN) 800 MG tablet Take 1 tablet (800 mg total) by mouth 3 (three) times daily. 06/04/17   Orpah Greek, MD  magic mouthwash w/lidocaine SOLN Take 5 mLs by mouth 3 (three) times daily as needed for mouth pain. Swish and spit, do not swallow 08/02/17    Triplett, Tammy, PA-C  polyethylene glycol (MIRALAX) packet Take 17 g by mouth daily. 10/25/17   Isla Pence, MD  pseudoephedrine (SUDAFED) 60 MG tablet Take 1 tablet (60 mg total) by mouth every 6 (six) hours as needed for congestion. 08/02/17   Triplett, Lynelle Smoke, PA-C    Family History No family history on file.  Social History Social History   Tobacco Use  . Smoking status: Former Research scientist (life sciences)  . Smokeless tobacco: Never Used  Substance Use Topics  . Alcohol use: No  . Drug use: Not Currently    Types: Marijuana     Allergies   Halls cough drops [menthol]   Review of Systems Review of Systems  All other systems reviewed and are negative.    Physical Exam Updated Vital Signs Ht 1.727 m (5\' 8" )   Wt 77.1 kg   SpO2 97%   BMI 25.85 kg/m   Physical Exam Vitals signs and nursing note reviewed.  Constitutional:      General: He is not in acute distress.    Appearance: He is well-developed.  HENT:     Head: Normocephalic and atraumatic.     Mouth/Throat:     Pharynx: No oropharyngeal exudate.  Eyes:     General: No scleral icterus.       Right eye: No discharge.        Left eye: No discharge.  Conjunctiva/sclera: Conjunctivae normal.     Pupils: Pupils are equal, round, and reactive to light.  Neck:     Musculoskeletal: Normal range of motion and neck supple.     Thyroid: No thyromegaly.     Vascular: No JVD.  Cardiovascular:     Rate and Rhythm: Normal rate and regular rhythm.     Heart sounds: Normal heart sounds. No murmur. No friction rub. No gallop.   Pulmonary:     Effort: Pulmonary effort is normal. No respiratory distress.     Breath sounds: Normal breath sounds. No wheezing or rales.  Abdominal:     General: Bowel sounds are normal. There is no distension.     Palpations: Abdomen is soft. There is no mass.     Tenderness: There is no abdominal tenderness.     Comments: There is no particular abdominal pain, I am able to mash deeply both right and  left, upper and lower without any painful response.  There is no rash to the skin  Musculoskeletal: Normal range of motion.        General: No tenderness.  Lymphadenopathy:     Cervical: No cervical adenopathy.  Skin:    General: Skin is warm and dry.     Findings: No erythema or rash.  Neurological:     Mental Status: He is alert.     Coordination: Coordination normal.  Psychiatric:        Behavior: Behavior normal.      ED Treatments / Results  Labs (all labs ordered are listed, but only abnormal results are displayed) Labs Reviewed - No data to display  EKG None  Radiology No results found.  Procedures Procedures (including critical care time)  Medications Ordered in ED Medications - No data to display   Initial Impression / Assessment and Plan / ED Course  I have reviewed the triage vital signs and the nursing notes.  Pertinent labs & imaging results that were available during my care of the patient were reviewed by me and considered in my medical decision making (see chart for details).        This could be related to constipation, the patient did have a bowel movement yesterday but states that he does have to strain to have a bowel movement.  There is no signs of zoster, no tenderness to be consistent with diverticulitis or cholecystitis or appendicitis.  The patient left AGAINST MEDICAL ADVICE walking out of the ER without informing staff, x-ray was never done  Final Clinical Impressions(s) / ED Diagnoses   Final diagnoses:  Abdominal pain, unspecified abdominal location    ED Discharge Orders    None       Eber HongMiller, Lexii Walsh, MD 10/01/18 1020

## 2018-11-10 ENCOUNTER — Other Ambulatory Visit: Payer: Self-pay

## 2018-11-10 ENCOUNTER — Emergency Department (HOSPITAL_COMMUNITY)
Admission: EM | Admit: 2018-11-10 | Discharge: 2018-11-10 | Disposition: A | Discharge status: Home / Self Care | Payer: Medicaid Other | Attending: Emergency Medicine | Admitting: Emergency Medicine

## 2018-11-10 ENCOUNTER — Encounter (HOSPITAL_COMMUNITY): Payer: Self-pay | Admitting: Emergency Medicine

## 2018-11-10 ENCOUNTER — Emergency Department (HOSPITAL_COMMUNITY): Payer: Medicaid Other

## 2018-11-10 DIAGNOSIS — J45909 Unspecified asthma, uncomplicated: Secondary | ICD-10-CM | POA: Diagnosis not present

## 2018-11-10 DIAGNOSIS — Z20828 Contact with and (suspected) exposure to other viral communicable diseases: Secondary | ICD-10-CM | POA: Insufficient documentation

## 2018-11-10 DIAGNOSIS — Z79899 Other long term (current) drug therapy: Secondary | ICD-10-CM | POA: Insufficient documentation

## 2018-11-10 DIAGNOSIS — J069 Acute upper respiratory infection, unspecified: Secondary | ICD-10-CM | POA: Diagnosis not present

## 2018-11-10 DIAGNOSIS — Z87891 Personal history of nicotine dependence: Secondary | ICD-10-CM | POA: Insufficient documentation

## 2018-11-10 DIAGNOSIS — R05 Cough: Secondary | ICD-10-CM | POA: Diagnosis present

## 2018-11-10 LAB — SARS CORONAVIRUS 2 (TAT 6-24 HRS): SARS Coronavirus 2: NEGATIVE

## 2018-11-10 LAB — GROUP A STREP BY PCR: Group A Strep by PCR: NOT DETECTED

## 2018-11-10 MED ORDER — PREDNISONE 20 MG PO TABS
40.0000 mg | ORAL_TABLET | Freq: Once | ORAL | Status: AC
  Administered 2018-11-10: 40 mg via ORAL
  Filled 2018-11-10: qty 2

## 2018-11-10 MED ORDER — PSEUDOEPHEDRINE HCL 60 MG PO TABS
60.0000 mg | ORAL_TABLET | Freq: Once | ORAL | Status: AC
  Administered 2018-11-10: 60 mg via ORAL
  Filled 2018-11-10: qty 1

## 2018-11-10 MED ORDER — ACETAMINOPHEN 325 MG PO TABS
650.0000 mg | ORAL_TABLET | Freq: Once | ORAL | Status: AC | PRN
  Administered 2018-11-10: 650 mg via ORAL
  Filled 2018-11-10: qty 2

## 2018-11-10 MED ORDER — ALBUTEROL SULFATE HFA 108 (90 BASE) MCG/ACT IN AERS
2.0000 | INHALATION_SPRAY | Freq: Once | RESPIRATORY_TRACT | Status: AC
  Administered 2018-11-10: 17:00:00 2 via RESPIRATORY_TRACT
  Filled 2018-11-10: qty 6.7

## 2018-11-10 NOTE — ED Triage Notes (Signed)
Per EMS patient from home. Patient complains of cough and congestion. States he used baking soda and water to clean out his nose. States his main complaint is a sore throat.   Temp of 100.1 in triage.

## 2018-11-10 NOTE — ED Provider Notes (Signed)
[facility] EMERGENCY DEPARTMENT Provider Note   CSN: [id number] Arrival date & time: 11/10/18  1354     History   Chief Complaint Chief Complaint  Patient presents with  . Cough    HPI Jeremy Michael is a 23 y.o. male.     The history is provided by the patient.  Sore Throat This is a new problem. The current episode started 2 days ago. The problem occurs constantly. The problem has been gradually worsening. Pertinent negatives include no chest pain, no abdominal pain and no shortness of breath. Associated symptoms comments: Cough and congestion. Nothing aggravates the symptoms. Nothing relieves the symptoms. Treatments tried: baking soda/ water for congestion. The treatment provided mild relief.    Past Medical History:  Diagnosis Date  . ADHD (attention deficit hyperactivity disorder)   . Asthma     Patient Active Problem List   Diagnosis Date Noted  . Left tibial fracture 08/01/2012    Past Surgical History:  Procedure Laterality Date  . ORIF PATELLA Left 08/02/2012   Procedure: OPEN REDUCTION INTERNAL (ORIF) FIXATION Proximal tibia;  Surgeon: Eulas PostJoshua P Landau, MD;  Location: [facility];  Service: Orthopedics;  Laterality: Left;        Home Medications    Prior to Admission medications   Medication Sig Start Date End Date Taking? Authorizing Provider  docusate sodium (COLACE) 100 MG capsule Take 1 capsule (100 mg total) by mouth every 12 (twelve) hours. 10/25/17   Jacalyn LefevreHaviland, Julie, MD  ibuprofen (ADVIL,MOTRIN) 800 MG tablet Take 1 tablet (800 mg total) by mouth 3 (three) times daily. 06/04/17   Gilda CreasePollina, Christopher J, MD  magic mouthwash w/lidocaine SOLN Take 5 mLs by mouth 3 (three) times daily as needed for mouth pain. Swish and spit, do not swallow 08/02/17   Triplett, Tammy, PA-C  polyethylene glycol (MIRALAX) packet Take 17 g by mouth daily. 10/25/17   Jacalyn LefevreHaviland, Julie, MD  pseudoephedrine (SUDAFED) 60 MG tablet Take 1 tablet (60 mg total) by mouth every 6 (six)  hours as needed for congestion. 08/02/17   Triplett, Babette Relicammy, PA-C    Family History No family history on file.  Social History Social History   Tobacco Use  . Smoking status: Former Games developermoker  . Smokeless tobacco: Never Used  Substance Use Topics  . Alcohol use: No  . Drug use: Not Currently    Types: Marijuana     Allergies   Halls cough drops [menthol]   Review of Systems Review of Systems  Constitutional: Positive for fever. Negative for activity change and appetite change.  HENT: Positive for congestion and sore throat. Negative for ear discharge, ear pain, facial swelling, nosebleeds, rhinorrhea, sinus pressure, sinus pain, sneezing and tinnitus.   Eyes: Negative for photophobia, pain and discharge.  Respiratory: Positive for cough. Negative for choking, shortness of breath and wheezing.   Cardiovascular: Negative for chest pain, palpitations and leg swelling.  Gastrointestinal: Negative for abdominal pain, blood in stool, constipation, diarrhea, nausea and vomiting.  Genitourinary: Negative for difficulty urinating, dysuria, flank pain, frequency and hematuria.  Musculoskeletal: Negative for back pain, gait problem, myalgias and neck pain.  Skin: Negative for color change, rash and wound.  Neurological: Negative for dizziness, seizures, syncope, facial asymmetry, speech difficulty, weakness and numbness.  Hematological: Negative for adenopathy. Does not bruise/bleed easily.  Psychiatric/Behavioral: Negative for agitation, confusion, hallucinations, self-injury and suicidal ideas. The patient is not nervous/anxious.      Physical Exam Updated Vital Signs BP 130/82 (BP Location: Right Arm)  Pulse 93   Temp 100.1 F (37.8 C) (Oral)   Resp 18   Ht 5\' 8"  (1.727 m)   Wt 77.1 kg   SpO2 100%   BMI 25.85 kg/m   Physical Exam Vitals signs and nursing note reviewed.  Constitutional:      Appearance: He is well-developed. He is not toxic-appearing.  HENT:     Head:  Normocephalic.     Right Ear: Tympanic membrane and external ear normal.     Left Ear: Tympanic membrane and external ear normal.     Nose: Congestion present.     Mouth/Throat:     Pharynx: Posterior oropharyngeal erythema present.  Eyes:     General: Lids are normal.     Pupils: Pupils are equal, round, and reactive to light.  Neck:     Musculoskeletal: Normal range of motion and neck supple.     Vascular: No carotid bruit.  Cardiovascular:     Rate and Rhythm: Normal rate and regular rhythm.     Pulses: Normal pulses.     Heart sounds: Normal heart sounds.  Pulmonary:     Effort: No respiratory distress.     Breath sounds: Wheezing and rhonchi present.  Abdominal:     General: Bowel sounds are normal.     Palpations: Abdomen is soft.     Tenderness: There is no abdominal tenderness. There is no guarding.  Musculoskeletal: Normal range of motion.  Lymphadenopathy:     Head:     Right side of head: No submandibular adenopathy.     Left side of head: No submandibular adenopathy.     Cervical: No cervical adenopathy.  Skin:    General: Skin is warm and dry.  Neurological:     Mental Status: He is alert and oriented to person, place, and time.     Cranial Nerves: No cranial nerve deficit.     Sensory: No sensory deficit.  Psychiatric:        Speech: Speech normal.      ED Treatments / Results  Labs (all labs ordered are listed, but only abnormal results are displayed) Labs Reviewed  GROUP A STREP BY PCR    EKG None  Radiology No results found.  Procedures Procedures (including critical care time)  Medications Ordered in ED Medications  acetaminophen (TYLENOL) tablet 650 mg (650 mg Oral Given 11/10/18 1421)     Initial Impression / Assessment and Plan / ED Course  I have reviewed the triage vital signs and the nursing notes.  Pertinent labs & imaging results that were available during my care of the patient were reviewed by me and considered in my  medical decision making (see chart for details).          Final Clinical Impressions(s) / ED Diagnoses MDM  Vital signs reviewed.  Pulse oximetry is 100% on room air.  Within normal limits by my interpretation.  The patient complained of some sore throat.  A strep test however was negative.  The patient was noted to have congestion.  Patient was treated with Sudafed in the ED.  Chest x-ray was read as negative for acute changes.  No sign of pneumonia or pneumothorax, or other acute changes.  I have made the patient aware of these findings.  A COVID-19 test was obtained as well.  The patient is to wash hands frequently, uses mass, maintain adequate distancing from others, and quarantine himself until he receives the results of the COVID-19 test.  I  have also asked him to use Tylenol every 4 hours or ibuprofen every 6 hours for temperature elevation, and/or aching.  The patient will use the decongestant medication of his choice for congestion.  Albuterol inhaler has been provided for the patient to use for wheezing and congestion.  The patient acknowledges understanding of these instructions.  He will see his primary physician or return to the emergency department if any changes in his condition, worsening of his symptoms, problems or concerns.   Final diagnoses:  Viral URI with cough    ED Discharge Orders    None       Ivery Quale, PA-C 11/10/18 1836    Donnetta Hutching, MD 11/11/18 [phone]

## 2018-11-10 NOTE — Discharge Instructions (Addendum)
Your temperature here in the emergency department was 100.1.  Your oxygen level is 100% on room air, which is within normal limits.  Please monitor your temperature closely.  Use Tylenol every 4 hours, or ibuprofen every 6 hours for temperature of 100 or greater.  Please increase your water, juices, Gatorade, etc.  Please wash your hands frequently.  Continue to use your mask.  Continue to maintain adequate distancing from others.  Use 2 puffs of albuterol every 4 hours for assistance with your breathing and with the mild wheezing that was noted on your exam.  Use Claritin-D, or the decongestant of your choice for congestion.  Please see your primary physician or return to the emergency department if your symptoms worsen, there are changes in your condition, problems or concerns. A COVID-19 test was obtained.  Please quarantine yourself until this test has returned.

## 2019-06-01 ENCOUNTER — Encounter (HOSPITAL_COMMUNITY): Payer: Self-pay | Admitting: *Deleted

## 2019-06-01 ENCOUNTER — Other Ambulatory Visit: Payer: Self-pay

## 2019-06-01 ENCOUNTER — Emergency Department (HOSPITAL_COMMUNITY): Payer: Medicaid Other

## 2019-06-01 ENCOUNTER — Emergency Department (HOSPITAL_COMMUNITY)
Admission: EM | Admit: 2019-06-01 | Discharge: 2019-06-01 | Disposition: A | Discharge status: Home / Self Care | Payer: Medicaid Other | Attending: Emergency Medicine | Admitting: Emergency Medicine

## 2019-06-01 DIAGNOSIS — R05 Cough: Secondary | ICD-10-CM | POA: Insufficient documentation

## 2019-06-01 DIAGNOSIS — J302 Other seasonal allergic rhinitis: Secondary | ICD-10-CM | POA: Diagnosis not present

## 2019-06-01 DIAGNOSIS — J45998 Other asthma: Secondary | ICD-10-CM | POA: Insufficient documentation

## 2019-06-01 DIAGNOSIS — J309 Allergic rhinitis, unspecified: Secondary | ICD-10-CM

## 2019-06-01 DIAGNOSIS — Z87891 Personal history of nicotine dependence: Secondary | ICD-10-CM | POA: Diagnosis not present

## 2019-06-01 DIAGNOSIS — J3489 Other specified disorders of nose and nasal sinuses: Secondary | ICD-10-CM | POA: Diagnosis present

## 2019-06-01 DIAGNOSIS — R059 Cough, unspecified: Secondary | ICD-10-CM

## 2019-06-01 MED ORDER — LORATADINE 10 MG PO TABS: 10.0000 mg | ORAL_TABLET | Freq: Every day | ORAL | 0 refills | Status: DC

## 2019-06-01 MED ORDER — FLUTICASONE PROPIONATE 50 MCG/ACT NA SUSP: 2.0000 | Freq: Every day | NASAL | 0 refills | Status: DC

## 2019-06-01 NOTE — Discharge Instructions (Signed)
You have been diagnosed today with Allergies and Cough.  At this time there does not appear to be the presence of an emergent medical condition, however there is always the potential for conditions to change. Please read and follow the below instructions.  Please return to the Emergency Department immediately for any new or worsening symptoms. Please be sure to follow up with your Primary Care Provider within one week regarding your visit today; please call their office to schedule an appointment even if you are feeling better for a follow-up visit. Continue using your mask and social distancing as appropriate. If you develop any fever, body aches, productive cough, nausea, vomiting, diarrhea or other symptoms concerning for COVID-19 please be sure to quarantine.   You may use the allergy medications Flonase and loratadine as prescribed up your symptoms.  Please drink plenty of water and get enough rest.  Get help right away if: Your tongue or your lips are swollen. You have trouble breathing. You feel dizzy or you feel like you are going to pass out (faint). You have cold sweats. You cough up blood. You have trouble breathing. Your heartbeat is very fast. You have fever or chills You have any new/concerning or worsening symptoms    Please read the additional information packets attached to your discharge summary.  Do not take your medicine if  develop an itchy rash, swelling in your mouth or lips, or difficulty breathing; call 911 and seek immediate emergency medical attention if this occurs.  Note: Portions of this text may have been transcribed using voice recognition software. Every effort was made to ensure accuracy; however, inadvertent computerized transcription errors may still be present.

## 2019-06-01 NOTE — ED Provider Notes (Signed)
[facility] EMERGENCY DEPARTMENT Provider Note   CSN: [id number] Arrival date & time: 06/01/19  1148     History No chief complaint on file.   Jeremy Michael is a 24 y.o. male who reports himself as otherwise healthy and without daily medication use.  Patient presents with 3 days of rhinorrhea, nasal congestion and nonproductive cough which she believes are attributable to allergies.  Reports clear rhinorrhea, nasal congestion as a mild pressure constant not radiating no clear aggravating or alleviating factors.  Cough is nonproductive without associated chest pain or shortness of breath.  He attempted 1 ibuprofen yesterday without relief of his symptoms thinking that this may help with allergies, he denies any pain.  Denies sick contacts, fever/chills, headache, sore throat, neck stiffness, chest pain/shortness breath, hemoptysis, abdominal pain, nausea/vomiting, numbness/weakness, tingling, swelling/color change of the extremities or any additional concerns.  Of note patient reports that he received both of his Covid shots in April 2021.  HPI     Past Medical History:  Diagnosis Date  . ADHD (attention deficit hyperactivity disorder)   . Asthma     Patient Active Problem List   Diagnosis Date Noted  . Left tibial fracture 08/01/2012    Past Surgical History:  Procedure Laterality Date  . ORIF PATELLA Left 08/02/2012   Procedure: OPEN REDUCTION INTERNAL (ORIF) FIXATION Proximal tibia;  Surgeon: Eulas Post, MD;  Location: [facility];  Service: Orthopedics;  Laterality: Left;       No family history on file.  Social History   Tobacco Use  . Smoking status: Former Games developer  . Smokeless tobacco: Never Used  Substance Use Topics  . Alcohol use: No  . Drug use: Not Currently    Types: Marijuana    Home Medications Prior to Admission medications   Medication Sig Start Date End Date Taking? Authorizing Provider  fluticasone (FLONASE) 50 MCG/ACT nasal spray Place 2  sprays into both nostrils daily. 06/01/19   Harlene Salts A, PA-C  loratadine (CLARITIN) 10 MG tablet Take 1 tablet (10 mg total) by mouth daily. 06/01/19   Harlene Salts A, PA-C    Allergies    Halls cough drops [menthol]  Review of Systems   Review of Systems  Constitutional: Negative.  Negative for chills and fever.  HENT: Positive for congestion and rhinorrhea. Negative for ear pain, facial swelling, sinus pain, sore throat, trouble swallowing and voice change.   Respiratory: Positive for cough. Negative for chest tightness and shortness of breath.   Cardiovascular: Negative.  Negative for chest pain and leg swelling.  Gastrointestinal: Negative.  Negative for abdominal pain, diarrhea, nausea and vomiting.  Musculoskeletal: Negative.  Negative for back pain and neck pain.  Neurological: Negative.  Negative for weakness and headaches.    Physical Exam Updated Vital Signs BP (!) 129/57   Pulse 64   Temp 98 F (36.7 C) (Oral)   Resp 16   Ht 5\' 8"  (1.727 m)   Wt 81.6 kg   SpO2 100%   BMI 27.37 kg/m   Physical Exam Constitutional:      General: He is not in acute distress.    Appearance: Normal appearance. He is well-developed. He is not ill-appearing or diaphoretic.  HENT:     Head: Normocephalic and atraumatic.     Jaw: There is normal jaw occlusion.     Right Ear: Tympanic membrane and external ear normal.     Left Ear: Tympanic membrane and external ear normal.  Nose: Rhinorrhea present. Rhinorrhea is clear.     Right Sinus: No maxillary sinus tenderness or frontal sinus tenderness.     Left Sinus: No maxillary sinus tenderness or frontal sinus tenderness.     Mouth/Throat:     Mouth: Mucous membranes are moist.     Pharynx: Oropharynx is clear.     Comments: The patient has normal phonation and is in control of secretions. No stridor.  Midline uvula without edema. Soft palate rises symmetrically. No tonsillar erythema, swelling or exudates. Tongue protrusion is  normal, floor of mouth is soft. No trismus. No creptius on neck palpation. No gingival erythema or fluctuance noted. Mucus membranes moist. Eyes:     General: Vision grossly intact. Gaze aligned appropriately.     Extraocular Movements: Extraocular movements intact.     Conjunctiva/sclera: Conjunctivae normal.     Pupils: Pupils are equal, round, and reactive to light.  Neck:     Trachea: Trachea and phonation normal. No tracheal deviation.     Meningeal: Brudzinski's sign absent.  Cardiovascular:     Rate and Rhythm: Normal rate and regular rhythm.     Pulses: Normal pulses.     Heart sounds: Normal heart sounds.  Pulmonary:     Effort: Pulmonary effort is normal. No respiratory distress.     Breath sounds: Normal breath sounds and air entry.  Abdominal:     General: There is no distension.     Palpations: Abdomen is soft.     Tenderness: There is no abdominal tenderness. There is no guarding or rebound.  Musculoskeletal:        General: Normal range of motion.     Cervical back: Full passive range of motion without pain, normal range of motion and neck supple.     Right lower leg: No edema.     Left lower leg: No edema.  Lymphadenopathy:     Cervical: No cervical adenopathy.  Skin:    General: Skin is warm and dry.  Neurological:     Mental Status: He is alert.     GCS: GCS eye subscore is 4. GCS verbal subscore is 5. GCS motor subscore is 6.     Comments: Speech is clear and goal oriented, follows commands Major Cranial nerves without deficit, no facial droop Moves extremities without ataxia, coordination intact  Psychiatric:        Behavior: Behavior normal.     ED Results / Procedures / Treatments   Labs (all labs ordered are listed, but only abnormal results are displayed) Labs Reviewed - No data to display  EKG None  Radiology DG Chest Portable 1 View  Result Date: 06/01/2019 CLINICAL DATA:  Cough EXAM: PORTABLE CHEST 1 VIEW COMPARISON:  11/10/2018 FINDINGS:  The heart size and mediastinal contours are within normal limits. Both lungs are clear. No pleural effusion. The visualized skeletal structures are unremarkable. IMPRESSION: No acute process in the chest. Electronically Signed   By: Macy Mis M.D.   On: 06/01/2019 13:31    Procedures Procedures (including critical care time)  Medications Ordered in ED Medications - No data to display  ED Course  I have reviewed the triage vital signs and the nursing notes.  Pertinent labs & imaging results that were available during my care of the patient were reviewed by me and considered in my medical decision making (see chart for details).    MDM Rules/Calculators/A&P  HOUA ACKERT is a 24 y.o. male who presents to ED for nasal congestion, rhinorrhea and nonproductive cough for 3 days.  He attempted taking ibuprofen thinking this would help with allergies, no relief.  He denies any pain, shortness of breath or additional concerns today.  Of note he denies sick contacts and reports that he received both of his Covid vaccines in April 2021.  Patient well-appearing no acute distress cranial nerves intact, mild rhinorrhea is present, no evidence of sinusitis, airway clear no evidence of PTA, RPA, Ludwick's, pharyngitis, dental abscess or other bacterial/viral infections.  Heart regular rate and rhythm, lungs clear, abdomen soft nontender, neurovascular intact to all 4 extremities no evidence of DVT.  Will obtain chest x-ray for evaluation of cough.  Will prescribe Flonase and H2 blocker.  Will encourage follow-up with PCP.  Of note patient refused Covid test today, feels is reasonable at this time as patient reports that he has had 2 Covid vaccines, he has no fever or other symptoms concerning for Covid at this time.  CXR:  IMPRESSION:  No acute process in the chest.  Have personally viewed patient's chest x-ray and agree with radiologist interpretation.  Suspect patient symptoms  today secondary to allergies, doubt Covid given he reports that he has had both of his Covid vaccines, additionally no fever, productive cough, body aches or other symptoms suggestive of viral process at this time.  He is on attempted any allergy medications, as above will provide Flonase and loratadine and encourage PCP follow-up.  Doubt bacterial infection or other acute process requiring further work-up at this time.  At this time there does not appear to be any evidence of an acute emergency medical condition and the patient appears stable for discharge with appropriate outpatient follow up. Diagnosis was discussed with patient who verbalizes understanding of care plan and is agreeable to discharge. I have discussed return precautions with patient who verbalizes understanding. Patient encouraged to follow-up with their PCP. All questions answered.  Note: Portions of this report may have been transcribed using voice recognition software. Every effort was made to ensure accuracy; however, inadvertent computerized transcription errors may still be present. Final Clinical Impression(s) / ED Diagnoses Final diagnoses:  Allergic rhinitis, unspecified seasonality, unspecified trigger  Cough    Rx / DC Orders ED Discharge Orders         Ordered    fluticasone (FLONASE) 50 MCG/ACT nasal spray  Daily     06/01/19 1351    loratadine (CLARITIN) 10 MG tablet  Daily     06/01/19 1351           Elizabeth Palau 06/01/19 1401    Cathren Laine, MD 06/02/19 1439

## 2019-06-01 NOTE — ED Triage Notes (Signed)
Pt c/o nasal drainage/congestion, dry cough, sneezing x couple of days. Denies fever, sore throat. Pt has used Advil at home without relief.

## 2019-06-14 ENCOUNTER — Emergency Department (HOSPITAL_COMMUNITY)
Admission: EM | Admit: 2019-06-14 | Discharge: 2019-06-14 | Disposition: A | Discharge status: Home / Self Care | Payer: Medicaid Other | Attending: Emergency Medicine | Admitting: Emergency Medicine

## 2019-06-14 ENCOUNTER — Encounter (HOSPITAL_COMMUNITY): Payer: Self-pay | Admitting: *Deleted

## 2019-06-14 ENCOUNTER — Other Ambulatory Visit: Payer: Self-pay

## 2019-06-14 DIAGNOSIS — J45909 Unspecified asthma, uncomplicated: Secondary | ICD-10-CM | POA: Insufficient documentation

## 2019-06-14 DIAGNOSIS — L03116 Cellulitis of left lower limb: Secondary | ICD-10-CM | POA: Insufficient documentation

## 2019-06-14 DIAGNOSIS — Z87891 Personal history of nicotine dependence: Secondary | ICD-10-CM | POA: Diagnosis not present

## 2019-06-14 DIAGNOSIS — M79652 Pain in left thigh: Secondary | ICD-10-CM | POA: Diagnosis present

## 2019-06-14 MED ORDER — CEPHALEXIN 500 MG PO CAPS: 500.0000 mg | ORAL_CAPSULE | Freq: Two times a day (BID) | ORAL | 0 refills | Status: AC

## 2019-06-14 NOTE — ED Triage Notes (Signed)
Pt states he has an abscess on his upper left thigh; pt states he tried to squeeze it but nothing came out

## 2019-06-14 NOTE — ED Provider Notes (Signed)
[facility] EMERGENCY DEPARTMENT Provider Note   CSN: [id number] Arrival date & time: 06/14/19  1750     History Chief Complaint  Patient presents with  . Abscess    Jeremy Michael is a 24 y.o. male with history of ADHD, asthma presenting for evaluation of acute onset, persistent and progressively worsening left thigh pain for 3 days.  He reports he thinks that he was bitten by a spider because he noticed some warmth and induration to the left medial thigh which has worsened somewhat.  He reports he attempted to express purulence from the area but was unable to.  He denies fevers, nausea, vomiting.  He has not tried anything for his symptoms.  The history is provided by the patient.       Past Medical History:  Diagnosis Date  . ADHD (attention deficit hyperactivity disorder)   . Asthma     Patient Active Problem List   Diagnosis Date Noted  . Left tibial fracture 08/01/2012    Past Surgical History:  Procedure Laterality Date  . ORIF PATELLA Left 08/02/2012   Procedure: OPEN REDUCTION INTERNAL (ORIF) FIXATION Proximal tibia;  Surgeon: Johnny Bridge, MD;  Location: [facility];  Service: Orthopedics;  Laterality: Left;       History reviewed. No pertinent family history.  Social History   Tobacco Use  . Smoking status: Former Research scientist (life sciences)  . Smokeless tobacco: Never Used  Substance Use Topics  . Alcohol use: No  . Drug use: Not Currently    Types: Marijuana    Home Medications Prior to Admission medications   Medication Sig Start Date End Date Taking? Authorizing Provider  cephALEXin (KEFLEX) 500 MG capsule Take 1 capsule (500 mg total) by mouth 2 (two) times daily for 7 days. 06/14/19 06/21/19  Rodell Perna A, PA-C  fluticasone (FLONASE) 50 MCG/ACT nasal spray Place 2 sprays into both nostrils daily. Patient not taking: Reported on 06/14/2019 06/01/19   Nuala Alpha A, PA-C  loratadine (CLARITIN) 10 MG tablet Take 1 tablet (10 mg total) by mouth daily. Patient not  taking: Reported on 06/14/2019 06/01/19   Nuala Alpha A, PA-C    Allergies    Halls cough drops [menthol]  Review of Systems   Review of Systems  Constitutional: Negative for fever.  Musculoskeletal: Positive for myalgias.  Skin: Positive for color change.  Neurological: Negative for weakness and numbness.    Physical Exam Updated Vital Signs BP 107/73   Pulse 75   Temp 98.6 F (37 C) (Oral)   Resp 16   Ht 5\' 8"  (1.727 m)   Wt 81.6 kg   SpO2 98%   BMI 27.37 kg/m   Physical Exam Vitals and nursing note reviewed.  Constitutional:      General: He is not in acute distress.    Appearance: He is well-developed.  HENT:     Head: Normocephalic and atraumatic.  Eyes:     General:        Right eye: No discharge.        Left eye: No discharge.     Conjunctiva/sclera: Conjunctivae normal.  Neck:     Vascular: No JVD.     Trachea: No tracheal deviation.  Cardiovascular:     Rate and Rhythm: Normal rate.  Pulmonary:     Effort: Pulmonary effort is normal.  Abdominal:     General: There is no distension.  Musculoskeletal:     Cervical back: Neck supple.  Skin:    General:  Skin is warm and dry.     Findings: Erythema present.     Comments: 2 x 3 cm area of induration to the left medial thigh.  No fluctuance.  Mildly tender to palpation.  No streaking.  No desquamation.  No vesicles.  Neurological:     Mental Status: He is alert.  Psychiatric:        Behavior: Behavior normal.     ED Results / Procedures / Treatments   Labs (all labs ordered are listed, but only abnormal results are displayed) Labs Reviewed - No data to display  EKG None  Radiology No results found.  Procedures Procedures (including critical care time)  Medications Ordered in ED Medications - No data to display  ED Course  I have reviewed the triage vital signs and the nursing notes.  Pertinent labs & imaging results that were available during my care of the patient were reviewed by  me and considered in my medical decision making (see chart for details).    MDM Rules/Calculators/A&P                      History and physical examination consistent with lower extremity cellulitis.  Patient is afebrile, vital signs are stable.  He is nontoxic in appearance, overall quite well-appearing.  No evidence of underlying drainable abscess.  Doubt DVT.  Recommend warm compresses and will initiate course of antibiotics.  Recommend follow-up with PCP for reevaluation.  Discussed strict ED return precautions. Pt verbalized understanding of and agreement with plan and is safe for discharge home at this time.  Final Clinical Impression(s) / ED Diagnoses Final diagnoses:  Cellulitis of left lower extremity    Rx / DC Orders ED Discharge Orders         Ordered    cephALEXin (KEFLEX) 500 MG capsule  2 times daily     06/14/19 2040           Bennye Alm 06/14/19 2043    Maia Plan, MD 06/14/19 [id number]

## 2019-06-14 NOTE — Discharge Instructions (Signed)
Please take all of your antibiotics until finished!   Take your antibiotics with food.  Common side effects of antibiotics include nausea, vomiting, abdominal discomfort, and diarrhea. You may help offset some of this with probiotics which you can buy or get in yogurt. Do not eat  or take the probiotics until 2 hours after your antibiotic.    Some studies suggest that certain antibiotics can reduce the efficacy of certain oral contraceptive pills (birth control), so please use additional contraceptives (condoms or other barrier method) while you are taking the antibiotics and for an additional 5 to 7 days afterwards if you are a male on these medications.  You can take 1 to 2 tablets of Tylenol (350mg -1000mg  depending on the dose) every 6 hours as needed for pain.  Do not exceed 4000 mg of Tylenol daily.  If your pain persists you can take a doses of ibuprofen in between doses of Tylenol.  I usually recommend 400 to 600 mg of ibuprofen every 6 hours.  Take this with food to avoid upset stomach issues.  Apply warm compresses to the area 2 or 3 times daily.  Return to the emergency department if any concerning signs or symptoms develop such as fevers, severe swelling, severe pain, abnormal drainage, streaking of redness up the leg.

## 2019-06-26 ENCOUNTER — Other Ambulatory Visit: Payer: Self-pay

## 2019-06-26 ENCOUNTER — Emergency Department (HOSPITAL_COMMUNITY)
Admission: EM | Admit: 2019-06-26 | Discharge: 2019-06-27 | Disposition: A | Discharge status: Home / Self Care | Payer: Medicaid Other | Attending: Emergency Medicine | Admitting: Emergency Medicine

## 2019-06-26 ENCOUNTER — Encounter (HOSPITAL_COMMUNITY): Payer: Self-pay | Admitting: *Deleted

## 2019-06-26 DIAGNOSIS — R112 Nausea with vomiting, unspecified: Secondary | ICD-10-CM | POA: Insufficient documentation

## 2019-06-26 DIAGNOSIS — R197 Diarrhea, unspecified: Secondary | ICD-10-CM | POA: Diagnosis not present

## 2019-06-26 LAB — POTASSIUM: Potassium: 3.5 mmol/L (ref 3.5–5.1)

## 2019-06-26 LAB — CBC WITH DIFFERENTIAL/PLATELET
Abs Immature Granulocytes: 0.03 10*3/uL (ref 0.00–0.07)
Basophils Absolute: 0 10*3/uL (ref 0.0–0.1)
Basophils Relative: 0 %
Eosinophils Absolute: 0 10*3/uL (ref 0.0–0.5)
Eosinophils Relative: 0 %
HCT: 49.9 % (ref 39.0–52.0)
Hemoglobin: 16.7 g/dL (ref 13.0–17.0)
Immature Granulocytes: 0 %
Lymphocytes Relative: 3 %
Lymphs Abs: 0.4 10*3/uL — ABNORMAL LOW (ref 0.7–4.0)
MCH: 30.9 pg (ref 26.0–34.0)
MCHC: 33.5 g/dL (ref 30.0–36.0)
MCV: 92.2 fL (ref 80.0–100.0)
Monocytes Absolute: 0.4 10*3/uL (ref 0.1–1.0)
Monocytes Relative: 4 %
Neutro Abs: 11.1 10*3/uL — ABNORMAL HIGH (ref 1.7–7.7)
Neutrophils Relative %: 93 %
Platelets: 197 10*3/uL (ref 150–400)
RBC: 5.41 MIL/uL (ref 4.22–5.81)
RDW: 12.5 % (ref 11.5–15.5)
WBC: 12 10*3/uL — ABNORMAL HIGH (ref 4.0–10.5)
nRBC: 0 % (ref 0.0–0.2)

## 2019-06-26 LAB — COMPREHENSIVE METABOLIC PANEL
ALT: 30 U/L (ref 0–44)
AST: 53 U/L — ABNORMAL HIGH (ref 15–41)
Albumin: 5.2 g/dL — ABNORMAL HIGH (ref 3.5–5.0)
Alkaline Phosphatase: 92 U/L (ref 38–126)
Anion gap: 13 (ref 5–15)
BUN: 12 mg/dL (ref 6–20)
CO2: 24 mmol/L (ref 22–32)
Calcium: 9.7 mg/dL (ref 8.9–10.3)
Chloride: 102 mmol/L (ref 98–111)
Creatinine, Ser: 1.05 mg/dL (ref 0.61–1.24)
GFR calc Af Amer: >60 mL/min (ref >60)
GFR calc non Af Amer: >60 mL/min (ref >60)
Glucose, Bld: 91 mg/dL (ref 70–99)
Potassium: 6 mmol/L — ABNORMAL HIGH (ref 3.5–5.1)
Sodium: 139 mmol/L (ref 135–145)
Total Bilirubin: 1.7 mg/dL — ABNORMAL HIGH (ref 0.3–1.2)
Total Protein: 9.1 g/dL — ABNORMAL HIGH (ref 6.5–8.1)

## 2019-06-26 MED ORDER — PROMETHAZINE HCL 25 MG/ML IJ SOLN
12.5000 mg | Freq: Once | INTRAMUSCULAR | Status: DC
  Filled 2019-06-26: qty 1

## 2019-06-26 MED ORDER — ONDANSETRON HCL 8 MG PO TABS: 8.0000 mg | ORAL_TABLET | ORAL | 0 refills | Status: DC | PRN

## 2019-06-26 MED ORDER — METOCLOPRAMIDE HCL 5 MG/ML IJ SOLN
10.0000 mg | Freq: Once | INTRAMUSCULAR | Status: AC
  Administered 2019-06-26: 10 mg via INTRAVENOUS
  Filled 2019-06-26: qty 2

## 2019-06-26 MED ORDER — PROMETHAZINE HCL 25 MG/ML IJ SOLN
25.0000 mg | Freq: Once | INTRAMUSCULAR | Status: AC
  Administered 2019-06-26: 25 mg via INTRAVENOUS

## 2019-06-26 MED ORDER — DICYCLOMINE HCL 20 MG PO TABS: 20.0000 mg | ORAL_TABLET | Freq: Two times a day (BID) | ORAL | 0 refills | Status: AC

## 2019-06-26 MED ORDER — SODIUM CHLORIDE 0.9 % IV BOLUS
1000.0000 mL | Freq: Once | INTRAVENOUS | Status: AC
  Administered 2019-06-26: 1000 mL via INTRAVENOUS

## 2019-06-26 MED ORDER — LACTATED RINGERS IV BOLUS
1000.0000 mL | Freq: Once | INTRAVENOUS | Status: AC
  Administered 2019-06-26: 1000 mL via INTRAVENOUS

## 2019-06-26 MED ORDER — ONDANSETRON 8 MG PO TBDP: 8.0000 mg | ORAL_TABLET | Freq: Three times a day (TID) | ORAL | 0 refills | Status: AC | PRN

## 2019-06-26 MED ORDER — ONDANSETRON 4 MG PO TBDP
4.0000 mg | ORAL_TABLET | Freq: Once | ORAL | Status: AC
  Administered 2019-06-26: 4 mg via ORAL
  Filled 2019-06-26: qty 1

## 2019-06-26 NOTE — ED Provider Notes (Addendum)
[facility] EMERGENCY DEPARTMENT Provider Note   CSN: [id number] Arrival date & time: 06/26/19  1928     History Chief Complaint  Patient presents with  . Emesis    Jeremy Michael is a 24 y.o. male.  HPI      Jeremy Michael is a 24 y.o. male, with a history of ADHD and asthma, presenting to the ED with nausea, vomiting, and diarrhea beginning earlier today. He endorses multiple episodes of nonbilious, nonbloody emesis.  His last episode was about an hour prior to arrival. Denies any known sick contacts. Denies fever/chills, hematochezia/melena, abdominal pain, chest pain, shortness of breath, cough, urinary symptoms, or any other complaints.   Past Medical History:  Diagnosis Date  . ADHD (attention deficit hyperactivity disorder)   . Asthma     Patient Active Problem List   Diagnosis Date Noted  . Left tibial fracture 08/01/2012    Past Surgical History:  Procedure Laterality Date  . ORIF PATELLA Left 08/02/2012   Procedure: OPEN REDUCTION INTERNAL (ORIF) FIXATION Proximal tibia;  Surgeon: Eulas Post, MD;  Location: [facility];  Service: Orthopedics;  Laterality: Left;       History reviewed. No pertinent family history.  Social History   Tobacco Use  . Smoking status: Former Games developer  . Smokeless tobacco: Never Used  Substance Use Topics  . Alcohol use: No  . Drug use: Not Currently    Types: Marijuana    Home Medications Prior to Admission medications   Medication Sig Start Date End Date Taking? Authorizing Provider  dicyclomine (BENTYL) 20 MG tablet Take 1 tablet (20 mg total) by mouth 2 (two) times daily. 06/26/19   Shaquanta Harkless C, PA-C  fluticasone (FLONASE) 50 MCG/ACT nasal spray Place 2 sprays into both nostrils daily. Patient not taking: Reported on 06/14/2019 06/01/19   Harlene Salts A, PA-C  loratadine (CLARITIN) 10 MG tablet Take 1 tablet (10 mg total) by mouth daily. Patient not taking: Reported on 06/14/2019 06/01/19   Harlene Salts A,  PA-C  ondansetron (ZOFRAN ODT) 8 MG disintegrating tablet Take 1 tablet (8 mg total) by mouth every 8 (eight) hours as needed for nausea or vomiting. 06/26/19   Rosanna Bickle C, PA-C    Allergies    Halls cough drops [menthol]  Review of Systems   Review of Systems  Constitutional: Negative for chills and fever.  Respiratory: Negative for shortness of breath.   Cardiovascular: Negative for chest pain.  Gastrointestinal: Positive for diarrhea, nausea and vomiting. Negative for abdominal pain and blood in stool.  Genitourinary: Negative for dysuria, flank pain and hematuria.  Musculoskeletal: Negative for back pain.  Neurological: Negative for dizziness, syncope and weakness.  All other systems reviewed and are negative.   Physical Exam Updated Vital Signs BP 135/62 (BP Location: Right Arm)   Pulse 66   Temp 98.3 F (36.8 C) (Oral)   Resp 16   Ht 5\' 8"  (1.727 m)   Wt 81.6 kg   SpO2 98%   BMI 27.37 kg/m   Physical Exam Vitals and nursing note reviewed.  Constitutional:      General: He is not in acute distress.    Appearance: He is well-developed. He is not diaphoretic.  HENT:     Head: Normocephalic and atraumatic.     Mouth/Throat:     Mouth: Mucous membranes are moist.     Pharynx: Oropharynx is clear.  Eyes:     Conjunctiva/sclera: Conjunctivae normal.  Cardiovascular:  Rate and Rhythm: Normal rate and regular rhythm.     Pulses: Normal pulses.          Radial pulses are 2+ on the right side and 2+ on the left side.       Posterior tibial pulses are 2+ on the right side and 2+ on the left side.     Heart sounds: Normal heart sounds.     Comments: Tactile temperature in the extremities appropriate and equal bilaterally. Pulmonary:     Effort: Pulmonary effort is normal. No respiratory distress.     Breath sounds: Normal breath sounds.  Abdominal:     Palpations: Abdomen is soft.     Tenderness: There is no abdominal tenderness. There is no guarding.    Musculoskeletal:     Cervical back: Neck supple.     Right lower leg: No edema.     Left lower leg: No edema.  Lymphadenopathy:     Cervical: No cervical adenopathy.  Skin:    General: Skin is warm and dry.  Neurological:     Mental Status: He is alert.  Psychiatric:        Mood and Affect: Mood and affect normal.        Speech: Speech normal.        Behavior: Behavior normal.     ED Results / Procedures / Treatments   Labs (all labs ordered are listed, but only abnormal results are displayed) Labs Reviewed  COMPREHENSIVE METABOLIC PANEL  CBC WITH DIFFERENTIAL/PLATELET    EKG None  Radiology No results found.  Procedures Procedures (including critical care time)  Medications Ordered in ED Medications  sodium chloride 0.9 % bolus 1,000 mL (has no administration in time range)  promethazine (PHENERGAN) injection 12.5 mg (has no administration in time range)  ondansetron (ZOFRAN-ODT) disintegrating tablet 4 mg (4 mg Oral Given 06/26/19 2013)    ED Course  I have reviewed the triage vital signs and the nursing notes.  Pertinent labs & imaging results that were available during my care of the patient were reviewed by me and considered in my medical decision making (see chart for details).  Clinical Course as of Jun 26 2142  Sat Jun 26, 2019  2131 Patient had another episode of vomiting, despite Zofran.   [SJ]    Clinical Course User Index [SJ] Trayce Caravello, Helane Gunther, PA-C   MDM Rules/Calculators/A&P                      Patient presents with nausea, vomiting, and diarrhea. Patient is nontoxic appearing, afebrile, not tachycardic, not tachypneic, not hypotensive, maintains excellent SPO2 on room air, and is in no apparent distress.  No abdominal tenderness on my exam.  End of shift patient care handoff report given to Trish Mage, MD. Plan: Review lab work. Assure patient can pass oral fluid challenge.    Final Clinical Impression(s) / ED Diagnoses Final diagnoses:   Nausea vomiting and diarrhea    Rx / DC Orders ED Discharge Orders         Ordered    ondansetron (ZOFRAN ODT) 8 MG disintegrating tablet  Every 8 hours PRN     06/26/19 2116    dicyclomine (BENTYL) 20 MG tablet  2 times daily     06/26/19 2116           Layla Maw 06/26/19 2147    Lorayne Bender, PA-C 06/26/19 2153    Veryl Speak, MD 06/27/19  1504  

## 2019-06-26 NOTE — Discharge Instructions (Signed)
Nausea, Vomiting, and Diarrhea  Hand washing: Wash your hands throughout the day, but especially before and after touching the face, using the restroom, sneezing, coughing, or touching surfaces that have been coughed or sneezed upon. Hydration: Symptoms will be intensified and complicated by dehydration. Dehydration can also extend the duration of symptoms. Drink plenty of fluids and get plenty of rest. You should be drinking at least half a liter of water an hour to stay hydrated. Electrolyte drinks (ex. Gatorade, Powerade, Pedialyte) are also encouraged. You should be drinking enough fluids to make your urine light yellow, almost clear. If this is not the case, you are not drinking enough water. Please note that some of the treatments indicated below will not be effective if you are not adequately hydrated. Diet: Please concentrate on hydration, however, you may introduce food slowly.  Start with a clear liquid diet, progressed to a full liquid diet, and then bland solids as you are able. Pain or fever: Ibuprofen, Naproxen, or Tylenol for pain or fever.  Nausea/vomiting: Use the ondansetron (generic for Zofran) for nausea or vomiting.  This medication may not prevent all vomiting or nausea, but can help facilitate better hydration. Things that can help with nausea/vomiting also include peppermint/menthol candies, vitamin B12, and ginger. Diarrhea: May use medications such as loperamide (Imodium) or Bismuth subsalicylate (Pepto-Bismol). Bentyl: This medication is what is known as an antispasmodic and is intended to help reduce abdominal discomfort. Follow-up: Follow-up with a primary care provider on this matter. Return: Return should you develop a fever, bloody diarrhea, increased abdominal pain, uncontrolled vomiting, or any other major concerns.  For prescription assistance, may try using prescription discount sites or apps, such as goodrx.com 

## 2019-06-26 NOTE — ED Triage Notes (Signed)
Pt with abd pain earlier, denies at present.  Emesis 4-5 times today.

## 2019-06-27 LAB — URINALYSIS, ROUTINE W REFLEX MICROSCOPIC
Bilirubin Urine: NEGATIVE
Glucose, UA: NEGATIVE mg/dL
Hgb urine dipstick: NEGATIVE
Ketones, ur: 20 mg/dL — AB
Leukocytes,Ua: NEGATIVE
Nitrite: NEGATIVE
Protein, ur: NEGATIVE mg/dL
Specific Gravity, Urine: 1.02 (ref 1.005–1.030)
pH: 5 (ref 5.0–8.0)

## 2019-06-27 LAB — RAPID URINE DRUG SCREEN, HOSP PERFORMED
Amphetamines: NOT DETECTED
Barbiturates: NOT DETECTED
Benzodiazepines: NOT DETECTED
Cocaine: NOT DETECTED
Opiates: NOT DETECTED
Tetrahydrocannabinol: POSITIVE — AB

## 2019-06-27 NOTE — ED Notes (Signed)
Pt ambulatory with steady gate to bathroom with no assistance.

## 2019-09-08 ENCOUNTER — Other Ambulatory Visit: Payer: Self-pay

## 2019-09-08 ENCOUNTER — Emergency Department (HOSPITAL_COMMUNITY)
Admission: EM | Admit: 2019-09-08 | Discharge: 2019-09-08 | Disposition: A | Discharge status: Home / Self Care | Payer: Medicaid Other | Attending: Emergency Medicine | Admitting: Emergency Medicine

## 2019-09-08 ENCOUNTER — Encounter (HOSPITAL_COMMUNITY): Payer: Self-pay

## 2019-09-08 DIAGNOSIS — M545 Low back pain, unspecified: Secondary | ICD-10-CM

## 2019-09-08 DIAGNOSIS — J45909 Unspecified asthma, uncomplicated: Secondary | ICD-10-CM | POA: Diagnosis not present

## 2019-09-08 DIAGNOSIS — Z7951 Long term (current) use of inhaled steroids: Secondary | ICD-10-CM | POA: Insufficient documentation

## 2019-09-08 DIAGNOSIS — F909 Attention-deficit hyperactivity disorder, unspecified type: Secondary | ICD-10-CM | POA: Insufficient documentation

## 2019-09-08 DIAGNOSIS — Z20822 Contact with and (suspected) exposure to covid-19: Secondary | ICD-10-CM | POA: Diagnosis not present

## 2019-09-08 LAB — SARS CORONAVIRUS 2 BY RT PCR (HOSPITAL ORDER, PERFORMED IN ~~LOC~~ HOSPITAL LAB): SARS Coronavirus 2: NEGATIVE

## 2019-09-08 MED ORDER — KETOROLAC TROMETHAMINE 30 MG/ML IJ SOLN
15.0000 mg | Freq: Once | INTRAMUSCULAR | Status: AC
  Administered 2019-09-08: 15 mg via INTRAMUSCULAR
  Filled 2019-09-08: qty 1

## 2019-09-08 MED ORDER — METHOCARBAMOL 500 MG PO TABS: 500.0000 mg | ORAL_TABLET | Freq: Every evening | ORAL | 0 refills | Status: AC | PRN

## 2019-09-08 MED ORDER — NAPROXEN 500 MG PO TABS: 500.0000 mg | ORAL_TABLET | Freq: Two times a day (BID) | ORAL | 0 refills | Status: AC

## 2019-09-08 NOTE — Discharge Instructions (Signed)
Take naproxen 2 times a day with meals.  Do not take other anti-inflammatories at the same time (Advil, Motrin, ibuprofen, Aleve). You may supplement with Tylenol if you need further pain control. Use Robaxin as needed for muscle stiffness or soreness. Have caution, as this may make you tired or groggy. Do not drive or operate heavy machinery while taking this medication.  Use muscle creams (bengay, icy hot, salonpas) as needed for pain.  Do the back stretches listed in the paperwork to help with muscle pain. Follow up with your primary care doctor if pain is not improving with this treatment in 2 weeks.  Return to the ER if you develop high fevers, numbness, loss of bowel or bladder control, or any new or concerning symptoms.   We also tested you for Covid.  I will call you if it is positive.  If it is negative, you will not receive a phone call.  Either way, you may check online on MyChart.

## 2019-09-08 NOTE — ED Triage Notes (Signed)
Pt to er, pt states that he was getting out of his car and he had a sudden onset of back pain.  Pt states that it feels like the muscles are really tight.  Denies loss of bowel or bladder function.  Denies numbness or tingling in his legs.

## 2019-09-08 NOTE — ED Provider Notes (Signed)
[facility] EMERGENCY DEPARTMENT Provider Note   CSN: [id number] Arrival date & time: 09/08/19  1045     History Chief Complaint  Patient presents with  . Back Pain    Jeremy Michael is a 24 y.o. male presenting for evaluation of low back pain.  Patient states he had sharp back pain that he knows when getting out of car this morning and going up stairs.  Pain is described as a tightening/spasm feeling.  Is mostly in his mid low back, but also off to his left side and going into his left buttock.  He has not taken anything for this including Tylenol ibuprofen.  Pain is worse with movement, nothing makes it better.  No symptoms on the right side.  He denies fall, trauma, or injury.  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  He states he is bending forward towards the ground extended periods of time.  He started this job several days ago.  He denies known fevers, chills, shortness of breath, nausea, vomiting, abdominal pain, urinary symptoms, abnormal bowel movements.  He denies previous history of back problems.  He denies history of cancer or IVDU.  Patient states yesterday he developed nasal congestion and a cough. He has not have the covid vaccine  HPI     Past Medical History:  Diagnosis Date  . ADHD (attention deficit hyperactivity disorder)   . Asthma     Patient Active Problem List   Diagnosis Date Noted  . Left tibial fracture 08/01/2012    Past Surgical History:  Procedure Laterality Date  . ORIF PATELLA Left 08/02/2012   Procedure: OPEN REDUCTION INTERNAL (ORIF) FIXATION Proximal tibia;  Surgeon: Eulas Post, MD;  Location: [facility];  Service: Orthopedics;  Laterality: Left;       History reviewed. No pertinent family history.  Social History   Tobacco Use  . Smoking status: Never Smoker  . Smokeless tobacco: Never Used  Vaping Use  . Vaping Use: Never used  Substance Use Topics  . Alcohol use: No  . Drug use: Never     Home Medications Prior to Admission medications   Medication Sig Start Date End Date Taking? Authorizing Provider  dicyclomine (BENTYL) 20 MG tablet Take 1 tablet (20 mg total) by mouth 2 (two) times daily. 06/26/19   Joy, Shawn C, PA-C  methocarbamol (ROBAXIN) 500 MG tablet Take 1 tablet (500 mg total) by mouth at bedtime as needed for muscle spasms. 09/08/19   Maleigh Bagot, PA-C  naproxen (NAPROSYN) 500 MG tablet Take 1 tablet (500 mg total) by mouth 2 (two) times daily with a meal. 09/08/19   Chiamaka Latka, PA-C  ondansetron (ZOFRAN ODT) 8 MG disintegrating tablet Take 1 tablet (8 mg total) by mouth every 8 (eight) hours as needed for nausea or vomiting. 06/26/19   Joy, Shawn C, PA-C  fluticasone (FLONASE) 50 MCG/ACT nasal spray Place 2 sprays into both nostrils daily. Patient not taking: Reported on 06/14/2019 06/01/19 06/27/19  Harlene Salts A, PA-C  loratadine (CLARITIN) 10 MG tablet Take 1 tablet (10 mg total) by mouth daily. Patient not taking: Reported on 06/14/2019 06/01/19 06/27/19  Harlene Salts A, PA-C    Allergies    Halls cough drops [menthol]  Review of Systems   Review of Systems  Musculoskeletal: Positive for back pain.  Neurological: Negative for numbness.    Physical Exam Updated Vital Signs BP 132/79 (BP Location: Right Arm)   Pulse 83  Temp (!) 100.2 F (38.9 C) (Oral) Comment: provider aware  Resp 19   Ht 5\' 5"  (1.651 m)   Wt 81.6 kg   SpO2 100%   BMI 29.95 kg/m   Physical Exam Vitals and nursing note reviewed.  Constitutional:      General: He is not in acute distress.    Appearance: He is well-developed.     Comments: Appears nontoxic  HENT:     Head: Normocephalic and atraumatic.  Cardiovascular:     Rate and Rhythm: Normal rate and regular rhythm.     Pulses: Normal pulses.  Pulmonary:     Effort: Pulmonary effort is normal.     Breath sounds: Normal breath sounds.     Comments: Clear lung sounds in all fields Abdominal:      General: There is no distension.     Palpations: There is no mass.     Tenderness: There is no abdominal tenderness. There is no guarding or rebound.  Musculoskeletal:        General: Tenderness present. Normal range of motion.     Cervical back: Normal range of motion.     Comments: Tenderness palpation of left low back musculature and low midline spine.  No step offs or deformities.  No tenderness palpation of the right side musculature.  Pain extends into the buttocks.  Strength of lower extremities equal bilaterally.  Pedal pulses 2+ bilaterally.  Good distal sensation and cap refill.  No saddle paresthesia.  Skin:    General: Skin is warm.     Capillary Refill: Capillary refill takes less than 2 seconds.     Findings: No rash.  Neurological:     Mental Status: He is alert and oriented to person, place, and time.     ED Results / Procedures / Treatments   Labs (all labs ordered are listed, but only abnormal results are displayed) Labs Reviewed  SARS CORONAVIRUS 2 BY RT PCR (HOSPITAL ORDER, PERFORMED IN [facility])    EKG None  Radiology No results found.  Procedures Procedures (including critical care time)  Medications Ordered in ED Medications  ketorolac (TORADOL) 30 MG/ML injection 15 mg (15 mg Intramuscular Given 09/08/19 1152)    ED Course  I have reviewed the triage vital signs and the nursing notes.  Pertinent labs & imaging results that were available during my care of the patient were reviewed by me and considered in my medical decision making (see chart for details).    MDM Rules/Calculators/A&P                          Patient presented for evaluation of low back pain.  On exam, patient appears nontoxic.  Pain is reproducible with palpation of the musculature.  He has no risk factors for more concerning etiologies such as malignancy, infection, or cauda equina syndrome. No neuro deficits.  Pain began in the setting of recently starting a very  physical job, likely muscle strain/spasm.  However, patient with low-grade temperature of 100.2 on my evaluation, as well as cough and nasal congestion that began yesterday.  Consider Covid versus other viral illness as cause of worsened back pain. Will obtain Covid test.  Discussed continued symptomatic treatment for MSK back pain.  At this time, patient appears safe for discharge.  Return precautions given.  Patient states he understands and agrees to plan.  Final Clinical Impression(s) / ED Diagnoses Final diagnoses:  Acute left-sided low  back pain without sciatica    Rx / DC Orders ED Discharge Orders         Ordered    naproxen (NAPROSYN) 500 MG tablet  2 times daily with meals     Discontinue  Reprint     09/08/19 1144    methocarbamol (ROBAXIN) 500 MG tablet  At bedtime PRN     Discontinue  Reprint     09/08/19 1144           Khari Lett, PA-C 09/08/19 1732    Geoffery Lyons, MD 09/09/19 1426

## 2019-09-08 NOTE — ED Notes (Signed)
Pt able to get from wheel chair to bed, pt reports lower back pain, states that it is worse with movement.

## 2020-05-15 IMAGING — CR DG CHEST 1V PORT
1 series · 1 of 1 positions shown · non-contrast
Comparison: May 02, 2009

CLINICAL DATA: Fever; cough and congestion

EXAM:
PORTABLE CHEST 1 VIEW

[ap portable]
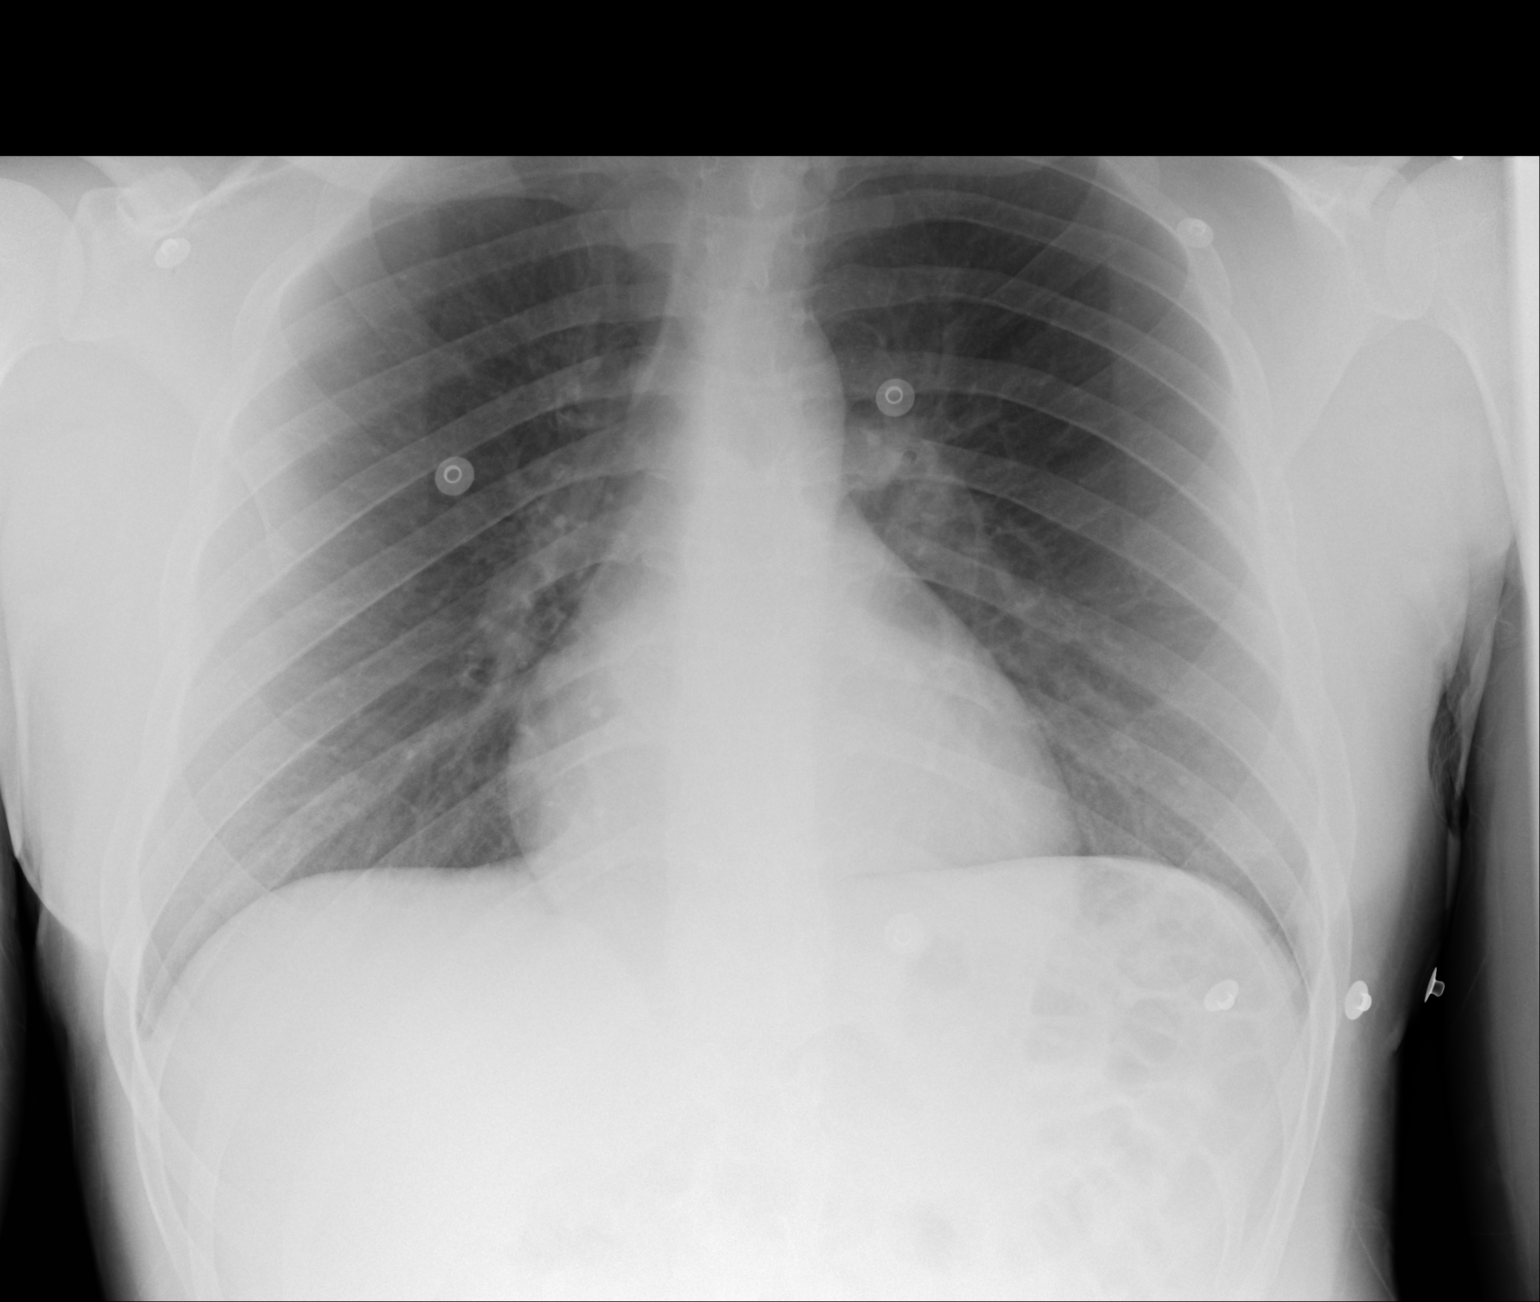

[1 of 1 positions shown; findings below may reference images not displayed]

FINDINGS: Lungs are clear. Heart size and pulmonary vascularity are normal. No
adenopathy. No bone lesions.
IMPRESSION: No edema or consolidation.

## 2020-11-26 ENCOUNTER — Emergency Department
Admission: EM | Admit: 2020-11-26 | Discharge: 2020-11-27 | Discharge status: Against Medical Advice | Payer: Medicaid Other

## 2020-11-27 ENCOUNTER — Other Ambulatory Visit: Payer: Self-pay

## 2020-11-27 ENCOUNTER — Ambulatory Visit: Payer: Medicaid Other | Admitting: Physician Assistant

## 2020-11-27 ENCOUNTER — Emergency Department
Admission: EM | Admit: 2020-11-27 | Discharge: 2020-11-27 | Disposition: A | Discharge status: Home / Self Care | Payer: Medicaid Other | Attending: Emergency Medicine | Admitting: Emergency Medicine

## 2020-11-27 DIAGNOSIS — B349 Viral infection, unspecified: Secondary | ICD-10-CM | POA: Diagnosis not present

## 2020-11-27 DIAGNOSIS — Z113 Encounter for screening for infections with a predominantly sexual mode of transmission: Secondary | ICD-10-CM | POA: Diagnosis not present

## 2020-11-27 DIAGNOSIS — J45909 Unspecified asthma, uncomplicated: Secondary | ICD-10-CM | POA: Insufficient documentation

## 2020-11-27 DIAGNOSIS — H9203 Otalgia, bilateral: Secondary | ICD-10-CM | POA: Insufficient documentation

## 2020-11-27 DIAGNOSIS — Z7951 Long term (current) use of inhaled steroids: Secondary | ICD-10-CM | POA: Diagnosis not present

## 2020-11-27 DIAGNOSIS — Z20822 Contact with and (suspected) exposure to covid-19: Secondary | ICD-10-CM | POA: Diagnosis not present

## 2020-11-27 DIAGNOSIS — R509 Fever, unspecified: Secondary | ICD-10-CM | POA: Diagnosis present

## 2020-11-27 DIAGNOSIS — J101 Influenza due to other identified influenza virus with other respiratory manifestations: Secondary | ICD-10-CM | POA: Insufficient documentation

## 2020-11-27 LAB — RESP PANEL BY RT-PCR (FLU A&B, COVID) ARPGX2
Influenza A by PCR: POSITIVE — AB
Influenza B by PCR: NEGATIVE
SARS Coronavirus 2 by RT PCR: NEGATIVE

## 2020-11-27 LAB — GRAM STAIN

## 2020-11-27 NOTE — ED Provider Notes (Signed)
[facility]-EMERGENCY DEPARTMENT  ____________________________________________  Time seen: Approximately 8:29 PM  I have reviewed the triage vital signs and the nursing notes.   HISTORY  Chief Complaint Fever (Fever chills by 2 days)   Historian Patient     HPI Jeremy Michael is a 25 y.o. male Pt complains of rhinorrhea, nasal congestion, nonproductive cough and generalized malaise for the past 2 days.  He denies chest pain, chest tightness or abdominal pain.   Past Medical History:  Diagnosis Date   ADHD (attention deficit hyperactivity disorder)    Asthma      Immunizations up to date:  Yes.     Past Medical History:  Diagnosis Date   ADHD (attention deficit hyperactivity disorder)    Asthma     Patient Active Problem List   Diagnosis Date Noted   Left tibial fracture 08/01/2012    Past Surgical History:  Procedure Laterality Date   ORIF PATELLA Left 08/02/2012   Procedure: OPEN REDUCTION INTERNAL (ORIF) FIXATION Proximal tibia;  Surgeon: Eulas Post, MD;  Location: [facility];  Service: Orthopedics;  Laterality: Left;    Prior to Admission medications   Medication Sig Start Date End Date Taking? Authorizing Provider  dicyclomine (BENTYL) 20 MG tablet Take 1 tablet (20 mg total) by mouth 2 (two) times daily. 06/26/19   Joy, Shawn C, PA-C  methocarbamol (ROBAXIN) 500 MG tablet Take 1 tablet (500 mg total) by mouth at bedtime as needed for muscle spasms. 09/08/19   Caccavale, Sophia, PA-C  naproxen (NAPROSYN) 500 MG tablet Take 1 tablet (500 mg total) by mouth 2 (two) times daily with a meal. 09/08/19   Caccavale, Sophia, PA-C  ondansetron (ZOFRAN ODT) 8 MG disintegrating tablet Take 1 tablet (8 mg total) by mouth every 8 (eight) hours as needed for nausea or vomiting. 06/26/19   Joy, Shawn C, PA-C  fluticasone (FLONASE) 50 MCG/ACT nasal spray Place 2 sprays into both nostrils daily. Patient not taking: Reported on 06/14/2019 06/01/19 06/27/19  Harlene Salts A, PA-C   loratadine (CLARITIN) 10 MG tablet Take 1 tablet (10 mg total) by mouth daily. Patient not taking: Reported on 06/14/2019 06/01/19 06/27/19  Harlene Salts A, PA-C    Allergies Halls cough drops [menthol]  No family history on file.  Social History Social History   Tobacco Use   Smoking status: Never   Smokeless tobacco: Never  Vaping Use   Vaping Use: Never used  Substance Use Topics   Alcohol use: No   Drug use: Never      Review of Systems  Constitutional: Patient has fever.  Eyes: No visual changes. No discharge ENT: Patient has congestion.  Cardiovascular: no chest pain. Respiratory: Patient has cough.  Gastrointestinal: No abdominal pain.  No nausea, no vomiting. Patient had diarrhea.  Genitourinary: Negative for dysuria. No hematuria Musculoskeletal: Patient has myalgias.  Skin: Negative for rash, abrasions, lacerations, ecchymosis. Neurological: Patient has headache, no focal weakness or numbness.      ____________________________________________   PHYSICAL EXAM:  VITAL SIGNS: ED Triage Vitals [11/27/20 2016]  Enc Vitals Group     BP 127/81     Pulse Rate 73     Resp 18     Temp 99.3 F (37.4 C)     Temp Source Oral     SpO2 100 %     Weight      Height      Head Circumference      Peak Flow      Pain  Score 0     Pain Loc      Pain Edu?      Excl. in [place]?      Constitutional: Alert and oriented. Patient is lying supine. Eyes: Conjunctivae are normal. PERRL. EOMI. Head: Atraumatic. ENT:      Ears: Tympanic membranes are mildly injected with mild effusion bilaterally.       Nose: No congestion/rhinnorhea.      Mouth/Throat: Mucous membranes are moist. Posterior pharynx is mildly erythematous.  Hematological/Lymphatic/Immunilogical: No cervical lymphadenopathy.  Cardiovascular: Normal rate, regular rhythm. Normal S1 and S2.  Good peripheral circulation. Respiratory: Normal respiratory effort without tachypnea or retractions. Lungs CTAB.  Good air entry to the bases with no decreased or absent breath sounds. Gastrointestinal: Bowel sounds 4 quadrants. Soft and nontender to palpation. No guarding or rigidity. No palpable masses. No distention. No CVA tenderness. Musculoskeletal: Full range of motion to all extremities. No gross deformities appreciated. Neurologic:  Normal speech and language. No gross focal neurologic deficits are appreciated.  Skin:  Skin is warm, dry and intact. No rash noted. Psychiatric: Mood and affect are normal. Speech and behavior are normal. Patient exhibits appropriate insight and judgement.   ____________________________________________   LABS (all labs ordered are listed, but only abnormal results are displayed)  Labs Reviewed  RESP PANEL BY RT-PCR (FLU A&B, COVID) [id number]   ____________________________________________  EKG   ____________________________________________  RADIOLOGY   No results found.  ____________________________________________    PROCEDURES  Procedure(s) performed:     Procedures     Medications - No data to display   ____________________________________________   INITIAL IMPRESSION / ASSESSMENT AND PLAN / ED COURSE  Pertinent labs & imaging results that were available during my care of the patient were reviewed by me and considered in my medical decision making (see chart for details).    Assessment and plan Viral illness 25 year old male presents to the emergency department with viral URI-like symptoms for the past 2 days.  COVID-19 and influenza testing results in process at this time.  Supportive measures were encouraged at home.  All patient questions were answered.      ____________________________________________  FINAL CLINICAL IMPRESSION(S) / ED DIAGNOSES  Final diagnoses:  Viral illness      NEW MEDICATIONS STARTED DURING THIS VISIT:  ED Discharge Orders     None           This chart was dictated using voice  recognition software/Dragon. Despite best efforts to proofread, errors can occur which can change the meaning. Any change was purely unintentional.     Lannie Fields, PA-C 11/27/20 2030    Duffy Bruce, MD 11/28/20 1500

## 2020-11-27 NOTE — Progress Notes (Signed)
Pt here for STD screening.  Gram stain results reviewed, no treatment required.  Condoms given.  Jesenya Bowditch M Scotti Motter, RN  

## 2020-11-27 NOTE — Discharge Instructions (Signed)
Take 650 mg of Tylenol every six hours as needed for fever. You can 600 mg of Ibuprofen every six hours as needed for fever.

## 2020-11-27 NOTE — ED Triage Notes (Signed)
Fever chills, "funny taste in mouth for 2 days" has been taking liquid niquil.

## 2020-11-28 ENCOUNTER — Encounter: Payer: Self-pay | Admitting: Physician Assistant

## 2020-11-28 NOTE — Progress Notes (Signed)
[facility] Department STI clinic/screening visit  Subjective:  Jeremy Michael is a 25 y.o. male being seen today for an STI screening visit. The patient reports they do have symptoms.    Patient has the following medical conditions:   Patient Active Problem List   Diagnosis Date Noted   Left tibial fracture 08/01/2012     Chief Complaint  Patient presents with   SEXUALLY TRANSMITTED DISEASE    screening    HPI  Patient reports that he is not having any genital symptoms but is having body/muscle aches, "hot flashes", headaches and overall "not feeling well" since yesterday.  Patient requests to proceed with STD screening even after counseling that he should be seen by PCP or urgent care for flu like symptoms.  Reports a history of asthma by states that he does not take any medicines regularly.  States that he has not previously had a HIV test and last void prior to sample collection for Gram stain was about 3 hr ago.   Screening for MPX risk: Does the patient have an unexplained rash? No Is the patient MSM? No Does the patient endorse multiple sex partners or anonymous sex partners? No Did the patient have close or sexual contact with a person diagnosed with MPX? No Has the patient traveled outside the [place] where MPX is endemic? No Is there a high clinical suspicion for MPX-- evidenced by one of the following No  -Unlikely to be chickenpox  -Lymphadenopathy  -Rash that present in same phase of evolution on any given body part   See flowsheet for further details and programmatic requirements.    The following portions of the patient's history were reviewed and updated as appropriate: allergies, current medications, past medical history, past social history, past surgical history and problem list.  Objective:  There were no vitals filed for this visit.  Physical Exam Constitutional:      General: He is not in acute distress.    Appearance: Normal  appearance.  HENT:     Head: Normocephalic and atraumatic.     Comments: No nits,lice, or hair loss. No cervical, supraclavicular or axillary adenopathy.     Mouth/Throat:     Mouth: Mucous membranes are moist.     Pharynx: Oropharynx is clear. No oropharyngeal exudate or posterior oropharyngeal erythema.  Eyes:     Conjunctiva/sclera: Conjunctivae normal.  Pulmonary:     Effort: Pulmonary effort is normal.  Abdominal:     Palpations: Abdomen is soft. There is no mass.     Tenderness: There is no abdominal tenderness. There is no guarding or rebound.  Genitourinary:    Penis: Normal.      Testes: Normal.     Comments: Pubic area without nits, lice, hair loss, edema, erythema, lesions and inguinal adenopathy. Penis circumcised without rash, lesions and discharge at meatus. Testicles descended bilaterally,nt, no masses or edema.  Musculoskeletal:     Cervical back: Neck supple. No tenderness.  Skin:    General: Skin is warm and dry.     Findings: No bruising, erythema, lesion or rash.  Neurological:     Mental Status: He is alert and oriented to person, place, and time.  Psychiatric:        Mood and Affect: Mood normal.        Behavior: Behavior normal.        Thought Content: Thought content normal.        Judgment: Judgment normal.  Assessment and Plan:  Jeremy Michael is a 25 y.o. male presenting to the [facility] Department for STI screening  1. Screening for STD (sexually transmitted disease) Patient into clinic without symptoms. Patient declines blood work today since he started feeling worse during his visit and stated that he was going to leave to be seen at an urgent care or PCP office. Counseled patient that we would call him if his testing from today showed anything that he needed to RTC for treatment for. Rec condoms with all sex. Await test results.  Counseled that RN will call if needs to RTC for treatment once results are back.  - Gram  stain - Gonococcus culture     No follow-ups on file.  No future appointments.  Matt Holmes, PA

## 2020-12-01 LAB — GONOCOCCUS CULTURE

## 2020-12-04 IMAGING — DX DG CHEST 1V PORT
1 series · 1 of 1 positions shown · non-contrast
Comparison: 11/10/2018

CLINICAL DATA: Cough

EXAM:
PORTABLE CHEST 1 VIEW

[chest ap]
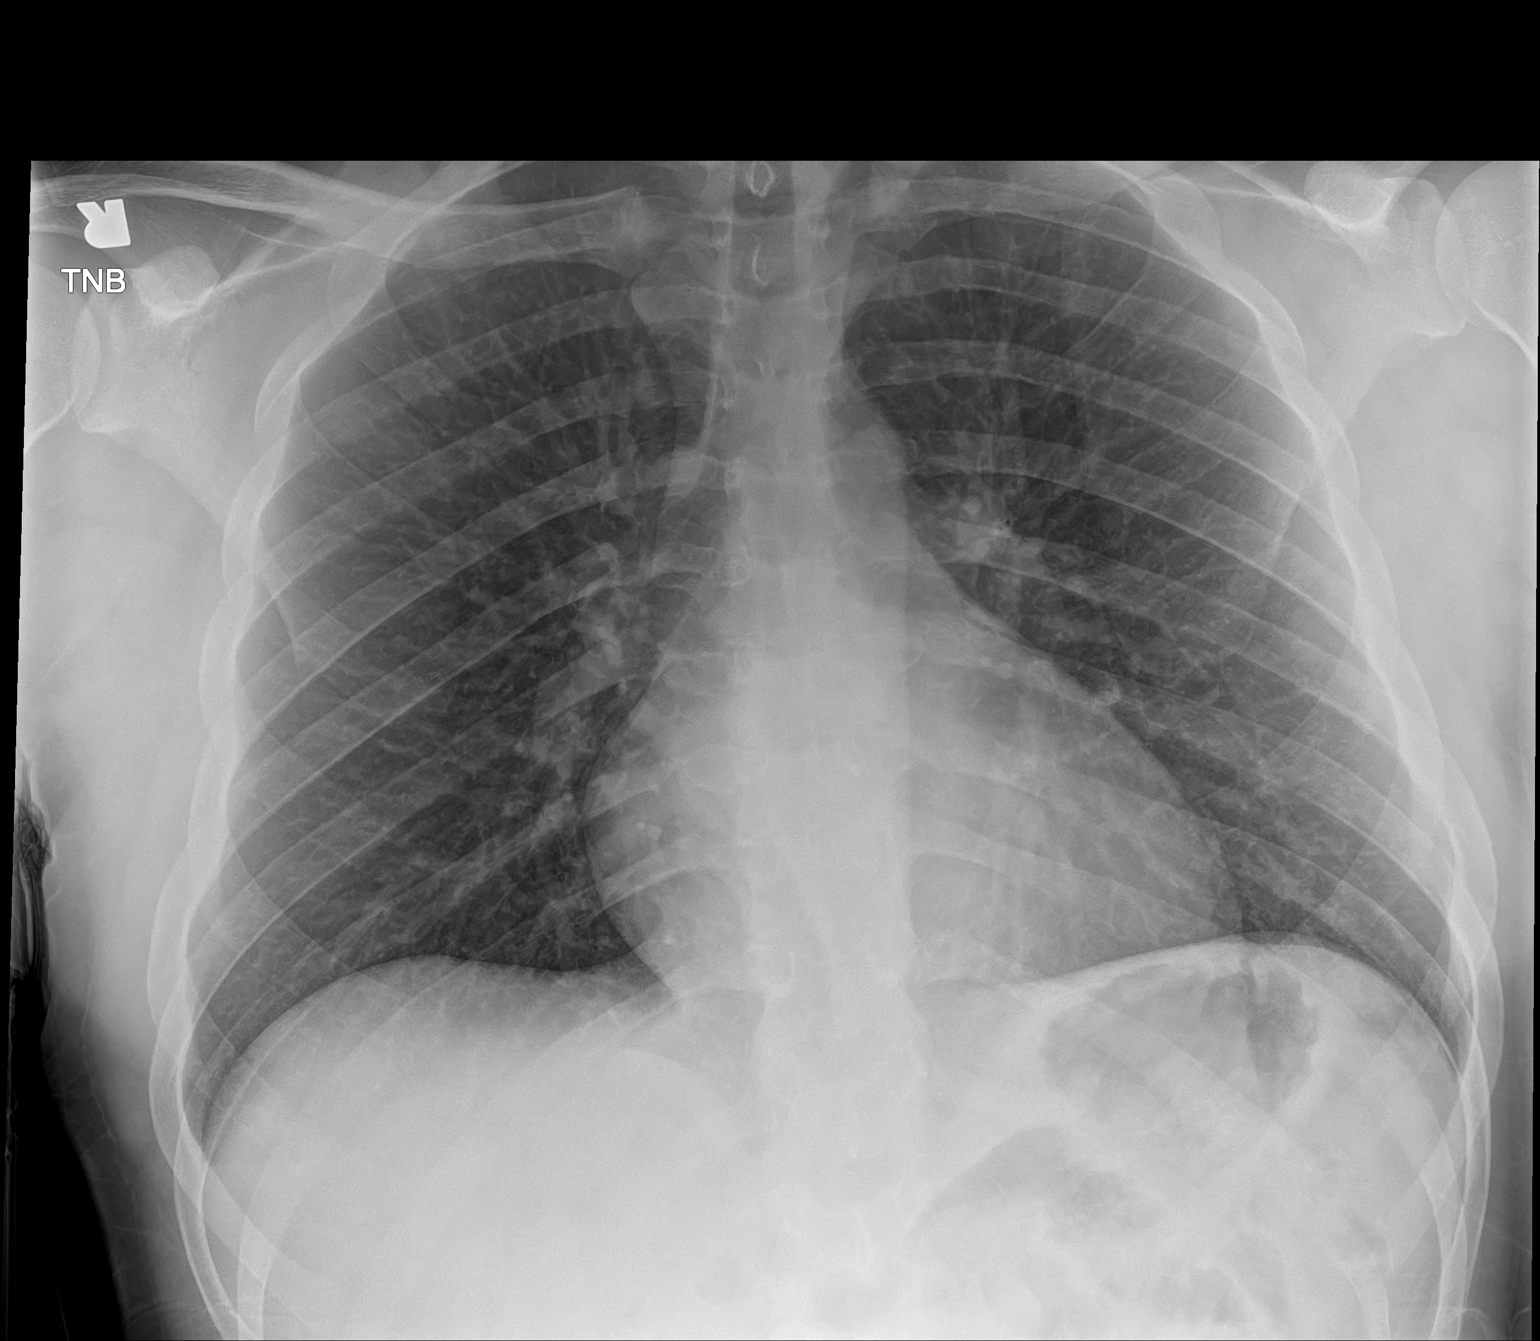

[1 of 1 positions shown; findings below may reference images not displayed]

FINDINGS: The heart size and mediastinal contours are within normal limits.
Both lungs are clear. No pleural effusion. The visualized skeletal
structures are unremarkable.
IMPRESSION: No acute process in the chest.

## 2021-01-08 ENCOUNTER — Emergency Department (HOSPITAL_COMMUNITY)
Admission: EM | Admit: 2021-01-08 | Discharge: 2021-01-08 | Disposition: A | Discharge status: Home / Self Care | Payer: Medicaid Other | Source: Home / Self Care

## 2021-01-09 ENCOUNTER — Emergency Department (HOSPITAL_COMMUNITY)
Admission: EM | Admit: 2021-01-09 | Discharge: 2021-01-09 | Disposition: A | Discharge status: Home / Self Care | Payer: Medicaid Other | Attending: Emergency Medicine | Admitting: Emergency Medicine

## 2021-01-09 ENCOUNTER — Encounter (HOSPITAL_COMMUNITY): Payer: Self-pay | Admitting: Emergency Medicine

## 2021-01-09 ENCOUNTER — Other Ambulatory Visit: Payer: Self-pay

## 2021-01-09 DIAGNOSIS — Z202 Contact with and (suspected) exposure to infections with a predominantly sexual mode of transmission: Secondary | ICD-10-CM | POA: Diagnosis not present

## 2021-01-09 DIAGNOSIS — J45909 Unspecified asthma, uncomplicated: Secondary | ICD-10-CM | POA: Diagnosis not present

## 2021-01-09 DIAGNOSIS — Z87891 Personal history of nicotine dependence: Secondary | ICD-10-CM | POA: Insufficient documentation

## 2021-01-09 DIAGNOSIS — Z7951 Long term (current) use of inhaled steroids: Secondary | ICD-10-CM | POA: Insufficient documentation

## 2021-01-09 DIAGNOSIS — Z113 Encounter for screening for infections with a predominantly sexual mode of transmission: Secondary | ICD-10-CM

## 2021-01-09 LAB — GC/CHLAMYDIA PROBE AMP (~~LOC~~) NOT AT ARMC
Chlamydia: NEGATIVE
Comment: NEGATIVE
Comment: NORMAL
Neisseria Gonorrhea: NEGATIVE

## 2021-01-09 NOTE — Discharge Instructions (Signed)
You have been seen and discharged from the emergency department.  It appears that your partner is most likely diagnosed with bacterial vaginosis and taking antibiotics.  This is not a common bacterial infection in males, it is not recommended that we treat asymptomatic males as you are generally not a carrier.  Urine testing has been sent off for GC/chlamydia and other STDs.  If this comes back abnormal or positive you will be contacted and treated.  Otherwise follow-up with your primary provider for reevaluation and further care. Take home medications as prescribed. If you have any worsening symptoms or further concerns for your health please return to an emergency department for further evaluation.

## 2021-01-09 NOTE — ED Triage Notes (Signed)
Pt arrives via pov with concerns about possible exposure to std after being told by partner that she had std after intercourse 5 days ago. Pt unsure of what partner has but states that she said it was a bacterial infection. Pt not currently having symptoms but request to be tested for STDs.

## 2021-01-09 NOTE — ED Provider Notes (Signed)
[facility] EMERGENCY DEPARTMENT Provider Note   CSN: [id number] Arrival date & time: 01/09/21  0865     History Chief Complaint  Patient presents with   Exposure to STD    Jeremy Michael is a 25 y.o. male.  HPI  25 year old male with past medical history of asthma presents emergency department with concern that his partner who is sexually active with was diagnosed with a bacterial infection.  He states that she termed a "bacterial vaginal infection".  He asked her if this was GC/chlamydia or an STD and she said no but that it was sexually transmitted.  She is currently on antibiotics.  The patient himself is asymptomatic.  No penile pain/swelling.  No dysuria or penile discharge.  No testicular pain/swelling.  No fever.  No history of previous STDs.  Past Medical History:  Diagnosis Date   ADHD (attention deficit hyperactivity disorder)    Asthma     Patient Active Problem List   Diagnosis Date Noted   Left tibial fracture 08/01/2012    Past Surgical History:  Procedure Laterality Date   ORIF PATELLA Left 08/02/2012   Procedure: OPEN REDUCTION INTERNAL (ORIF) FIXATION Proximal tibia;  Surgeon: Eulas Post, MD;  Location: [facility];  Service: Orthopedics;  Laterality: Left;       History reviewed. No pertinent family history.  Social History   Tobacco Use   Smoking status: Former    Types: Cigars   Smokeless tobacco: Never  Vaping Use   Vaping Use: Never used  Substance Use Topics   Alcohol use: No   Drug use: Not Currently    Types: Marijuana    Home Medications Prior to Admission medications   Medication Sig Start Date End Date Taking? Authorizing Provider  dicyclomine (BENTYL) 20 MG tablet Take 1 tablet (20 mg total) by mouth 2 (two) times daily. Patient not taking: Reported on 11/28/2020 06/26/19   Anselm Pancoast, PA-C  methocarbamol (ROBAXIN) 500 MG tablet Take 1 tablet (500 mg total) by mouth at bedtime as needed for muscle spasms. Patient not taking:  Reported on 11/28/2020 09/08/19   Caccavale, Sophia, PA-C  naproxen (NAPROSYN) 500 MG tablet Take 1 tablet (500 mg total) by mouth 2 (two) times daily with a meal. Patient not taking: Reported on 11/28/2020 09/08/19   Caccavale, Sophia, PA-C  ondansetron (ZOFRAN ODT) 8 MG disintegrating tablet Take 1 tablet (8 mg total) by mouth every 8 (eight) hours as needed for nausea or vomiting. Patient not taking: Reported on 11/28/2020 06/26/19   Joy, Ines Bloomer C, PA-C  fluticasone (FLONASE) 50 MCG/ACT nasal spray Place 2 sprays into both nostrils daily. Patient not taking: Reported on 06/14/2019 06/01/19 06/27/19  Harlene Salts A, PA-C  loratadine (CLARITIN) 10 MG tablet Take 1 tablet (10 mg total) by mouth daily. Patient not taking: Reported on 06/14/2019 06/01/19 06/27/19  Harlene Salts A, PA-C    Allergies    [facility] cough drops [menthol]  Review of Systems   Review of Systems  Constitutional:  Negative for chills and fever.  Respiratory:  Negative for shortness of breath.   Cardiovascular:  Negative for chest pain.  Gastrointestinal:  Negative for nausea and vomiting.  Genitourinary:  Negative for difficulty urinating, genital sores, penile discharge, penile pain, penile swelling, scrotal swelling and testicular pain.  Musculoskeletal:  Negative for myalgias.  Skin:  Negative for rash.  Neurological:  Negative for headaches.   Physical Exam Updated Vital Signs BP 116/81 (BP Location: Left Arm)   Pulse  74   Temp 97.7 F (36.5 C) (Oral)   Resp 14   Ht 5\' 7"  (1.702 m)   Wt 74.8 kg   SpO2 98%   BMI 25.84 kg/m   Physical Exam Vitals and nursing note reviewed.  Constitutional:      Appearance: Normal appearance.  HENT:     Head: Normocephalic.     Mouth/Throat:     Mouth: Mucous membranes are moist.  Cardiovascular:     Rate and Rhythm: Normal rate.  Pulmonary:     Effort: Pulmonary effort is normal. No respiratory distress.  Abdominal:     Palpations: Abdomen is soft.  Genitourinary:     Comments: Declined Skin:    General: Skin is warm.  Neurological:     Mental Status: He is alert and oriented to person, place, and time. Mental status is at baseline.  Psychiatric:        Mood and Affect: Mood normal.    ED Results / Procedures / Treatments   Labs (all labs ordered are listed, but only abnormal results are displayed) Labs Reviewed  GC/CHLAMYDIA PROBE AMP ([place]) NOT AT [facility]    EKG None  Radiology No results found.  Procedures Procedures   Medications Ordered in ED Medications - No data to display  ED Course  I have reviewed the triage vital signs and the nursing notes.  Pertinent labs & imaging results that were available during my care of the patient were reviewed by me and considered in my medical decision making (see chart for details).    MDM Rules/Calculators/A&P                           25 year old male presents emergency department concerned that his partner who is sexually active with was diagnosed with bacterial vaginal infection.  Does not believe that she was diagnosed with GC/chlamydia.  States that she is currently on antibiotics.  The patient himself is asymptomatic.  Denies any GU changes.  Vitals are stable.  Patient is well-appearing, patient declines GU exam.  We will plan for GC/chlamydia urine evaluation.  Patient is asymptomatic in the event that his partner is being treated for BV.  Is not recommended that we treat asymptomatic male carriers.  Patient will be educated and referred to outpatient.  Patient at this time appears safe and stable for discharge and will be treated as an outpatient.  Discharge plan and strict return to ED precautions discussed, patient verbalizes understanding and agreement.  Final Clinical Impression(s) / ED Diagnoses Final diagnoses:  None    Rx / DC Orders ED Discharge Orders     None        30, DO 01/09/21 0840

## 2021-01-10 ENCOUNTER — Emergency Department (HOSPITAL_COMMUNITY)
Admission: EM | Admit: 2021-01-10 | Discharge: 2021-01-10 | Disposition: A | Discharge status: Home / Self Care | Payer: Medicaid Other | Attending: Emergency Medicine | Admitting: Emergency Medicine

## 2021-01-10 ENCOUNTER — Other Ambulatory Visit: Payer: Self-pay

## 2021-01-10 ENCOUNTER — Encounter (HOSPITAL_COMMUNITY): Payer: Self-pay | Admitting: *Deleted

## 2021-01-10 DIAGNOSIS — J45909 Unspecified asthma, uncomplicated: Secondary | ICD-10-CM | POA: Diagnosis not present

## 2021-01-10 DIAGNOSIS — Z7951 Long term (current) use of inhaled steroids: Secondary | ICD-10-CM | POA: Diagnosis not present

## 2021-01-10 DIAGNOSIS — Z202 Contact with and (suspected) exposure to infections with a predominantly sexual mode of transmission: Secondary | ICD-10-CM

## 2021-01-10 DIAGNOSIS — Z87891 Personal history of nicotine dependence: Secondary | ICD-10-CM | POA: Insufficient documentation

## 2021-01-10 LAB — URINALYSIS, ROUTINE W REFLEX MICROSCOPIC
Bilirubin Urine: NEGATIVE
Glucose, UA: NEGATIVE mg/dL
Hgb urine dipstick: NEGATIVE
Ketones, ur: 80 mg/dL — AB
Leukocytes,Ua: NEGATIVE
Nitrite: NEGATIVE
Protein, ur: NEGATIVE mg/dL
Specific Gravity, Urine: 1.025 (ref 1.005–1.030)
pH: 5 (ref 5.0–8.0)

## 2021-01-10 MED ORDER — LIDOCAINE HCL (PF) 1 % IJ SOLN
INTRAMUSCULAR | Status: AC
  Filled 2021-01-10: qty 2

## 2021-01-10 MED ORDER — CEFTRIAXONE SODIUM 500 MG IJ SOLR
500.0000 mg | Freq: Once | INTRAMUSCULAR | Status: AC
  Administered 2021-01-10: 15:00:00 500 mg via INTRAMUSCULAR
  Filled 2021-01-10: qty 500

## 2021-01-10 MED ORDER — DOXYCYCLINE HYCLATE 100 MG PO CAPS: 100.0000 mg | ORAL_CAPSULE | Freq: Two times a day (BID) | ORAL | 0 refills | Status: AC

## 2021-01-10 NOTE — Discharge Instructions (Signed)
You were seen in the emergency department today after being exposed to STD.  While you were here we did an exam, sent off urine to test for STDs and are empirically treating you.  You received a dose of medication here in the emergency department.  You are being sent home with antibiotics for the next 7 days.  Please pick these up and take them until completion.  Please return if you begin to have any new drainage from your penis or swelling of your penis or testicles.

## 2021-01-10 NOTE — ED Provider Notes (Signed)
[facility] EMERGENCY DEPARTMENT Provider Note   CSN: [id number] Arrival date & time: 01/10/21  1155     History No chief complaint on file.   Jeremy Michael is a 25 y.o. male.  With past medical history of asthma who presents to the emergency department with STD exposure.  He states that he was "told by a male partner that she had a bacterial infection."  He is unable to tell me what bacterial infection she had.  He denies having any symptoms himself.  He denies penile pain, discharge, testicular pain or swelling, scrotal swelling, fevers or abdominal pain.  Denies any new rashes or lesions.   HPI     Past Medical History:  Diagnosis Date   ADHD (attention deficit hyperactivity disorder)    Asthma     Patient Active Problem List   Diagnosis Date Noted   Left tibial fracture 08/01/2012    Past Surgical History:  Procedure Laterality Date   ORIF PATELLA Left 08/02/2012   Procedure: OPEN REDUCTION INTERNAL (ORIF) FIXATION Proximal tibia;  Surgeon: Eulas Post, MD;  Location: [facility];  Service: Orthopedics;  Laterality: Left;       History reviewed. No pertinent family history.  Social History   Tobacco Use   Smoking status: Former    Types: Cigars   Smokeless tobacco: Never  Vaping Use   Vaping Use: Never used  Substance Use Topics   Alcohol use: No   Drug use: Not Currently    Types: Marijuana    Home Medications Prior to Admission medications   Medication Sig Start Date End Date Taking? Authorizing Provider  dicyclomine (BENTYL) 20 MG tablet Take 1 tablet (20 mg total) by mouth 2 (two) times daily. Patient not taking: Reported on 11/28/2020 06/26/19   Anselm Pancoast, PA-C  methocarbamol (ROBAXIN) 500 MG tablet Take 1 tablet (500 mg total) by mouth at bedtime as needed for muscle spasms. Patient not taking: Reported on 11/28/2020 09/08/19   Caccavale, Sophia, PA-C  naproxen (NAPROSYN) 500 MG tablet Take 1 tablet (500 mg total) by mouth 2 (two) times  daily with a meal. Patient not taking: Reported on 11/28/2020 09/08/19   Caccavale, Sophia, PA-C  ondansetron (ZOFRAN ODT) 8 MG disintegrating tablet Take 1 tablet (8 mg total) by mouth every 8 (eight) hours as needed for nausea or vomiting. Patient not taking: Reported on 11/28/2020 06/26/19   Joy, Ines Bloomer C, PA-C  fluticasone (FLONASE) 50 MCG/ACT nasal spray Place 2 sprays into both nostrils daily. Patient not taking: Reported on 06/14/2019 06/01/19 06/27/19  Harlene Salts A, PA-C  loratadine (CLARITIN) 10 MG tablet Take 1 tablet (10 mg total) by mouth daily. Patient not taking: Reported on 06/14/2019 06/01/19 06/27/19  Harlene Salts A, PA-C    Allergies    Halls cough drops [menthol]  Review of Systems   Review of Systems  All other systems reviewed and are negative.  Physical Exam Updated Vital Signs BP (!) 127/91 (BP Location: Right Arm)    Pulse 70    Temp 98.4 F (36.9 C) (Oral)    Resp 18    SpO2 97%   Physical Exam Vitals and nursing note reviewed. Exam conducted with a chaperone present.  Constitutional:      General: He is not in acute distress.    Appearance: Normal appearance. He is not ill-appearing or toxic-appearing.  HENT:     Head: Normocephalic and atraumatic.     Mouth/Throat:     Mouth: Mucous membranes  are moist.     Pharynx: Oropharynx is clear. No posterior oropharyngeal erythema.  Eyes:     General: No scleral icterus. Cardiovascular:     Pulses: Normal pulses.  Pulmonary:     Effort: Pulmonary effort is normal. No respiratory distress.  Abdominal:     General: Abdomen is flat. Bowel sounds are normal.     Palpations: Abdomen is soft.  Genitourinary:    Penis: Normal and circumcised. No erythema, tenderness, discharge, swelling or lesions.      Testes: Normal. Cremasteric reflex is present.        Right: Tenderness, swelling, testicular hydrocele or varicocele not present.        Left: Tenderness, swelling, testicular hydrocele or varicocele not present.      Epididymis:     Right: Normal.     Left: Normal.  Musculoskeletal:        General: Normal range of motion.  Skin:    General: Skin is warm and dry.     Findings: No rash.  Neurological:     General: No focal deficit present.     Mental Status: He is alert and oriented to person, place, and time.  Psychiatric:        Mood and Affect: Mood normal.        Behavior: Behavior normal.        Thought Content: Thought content normal.        Judgment: Judgment normal.    ED Results / Procedures / Treatments   Labs (all labs ordered are listed, but only abnormal results are displayed) Labs Reviewed  URINALYSIS, ROUTINE W REFLEX MICROSCOPIC  GC/CHLAMYDIA PROBE AMP ([place]) NOT AT [facility]    EKG None  Radiology No results found.  Procedures Procedures   Medications Ordered in ED Medications  lidocaine (PF) (XYLOCAINE) 1 % injection (has no administration in time range)  cefTRIAXone (ROCEPHIN) injection 500 mg (500 mg Intramuscular Given 01/10/21 1515)    ED Course  I have reviewed the triage vital signs and the nursing notes.  Pertinent labs & imaging results that were available during my care of the patient were reviewed by me and considered in my medical decision making (see chart for details).    MDM Rules/Calculators/A&P 25 year old male who presents to the emergency department with STI exposure.  Urine GC chlamydia ordered and sent off. Given 500 mg IM Rocephin here in the emergency department. Prescribe doxycycline 100 mg twice daily for the next 7 days. He verbalized understanding to complete antibiotics.  He is instructed to monitor for any new drainage or swelling or pain.  Safe for discharge Final Clinical Impression(s) / ED Diagnoses Final diagnoses:  Exposure to sexually transmitted disease (STD)    Rx / DC Orders ED Discharge Orders          Ordered    doxycycline (VIBRAMYCIN) 100 MG capsule  2 times daily        01/10/21 1508              Cristopher Peru, PA-C 01/10/21 1531    Gerhard Munch, MD 01/10/21 1544

## 2021-01-10 NOTE — ED Triage Notes (Signed)
Pt here for STD check. Denies any penile discharge or pain with urination.
